# Patient Record
Sex: Male | Born: 1935 | Race: White | Hispanic: No | Marital: Married | Smoking: Former smoker
Health system: Southern US, Community
[De-identification: ages and names within clinical notes are randomized; demographics above are authoritative.]

## PROBLEM LIST (undated history)

## (undated) DIAGNOSIS — C61 Malignant neoplasm of prostate: Secondary | ICD-10-CM

## (undated) DIAGNOSIS — M109 Gout, unspecified: Secondary | ICD-10-CM

## (undated) DIAGNOSIS — H409 Unspecified glaucoma: Secondary | ICD-10-CM

## (undated) DIAGNOSIS — C3490 Malignant neoplasm of unspecified part of unspecified bronchus or lung: Secondary | ICD-10-CM

## (undated) DIAGNOSIS — C4490 Unspecified malignant neoplasm of skin, unspecified: Secondary | ICD-10-CM

## (undated) DIAGNOSIS — I10 Essential (primary) hypertension: Secondary | ICD-10-CM

## (undated) DIAGNOSIS — C439 Malignant melanoma of skin, unspecified: Secondary | ICD-10-CM

## (undated) HISTORY — DX: Malignant neoplasm of prostate: C61

## (undated) HISTORY — DX: Essential (primary) hypertension: I10

## (undated) HISTORY — DX: Malignant melanoma of skin, unspecified: C43.9

## (undated) HISTORY — DX: Gout, unspecified: M10.9

## (undated) HISTORY — DX: Unspecified glaucoma: H40.9

## (undated) HISTORY — PX: CATARACT EXTRACTION: SUR2

---

## 1999-11-19 ENCOUNTER — Inpatient Hospital Stay (HOSPITAL_COMMUNITY): Admission: EM | Admit: 1999-11-19 | Discharge: 1999-11-20 | Payer: Self-pay | Admitting: Emergency Medicine

## 1999-11-19 ENCOUNTER — Encounter: Payer: Self-pay | Admitting: Emergency Medicine

## 2001-06-23 ENCOUNTER — Ambulatory Visit: Admission: RE | Admit: 2001-06-23 | Discharge: 2001-09-21 | Payer: Self-pay | Admitting: Radiation Oncology

## 2016-02-09 DIAGNOSIS — M1A9XX0 Chronic gout, unspecified, without tophus (tophi): Secondary | ICD-10-CM | POA: Insufficient documentation

## 2016-02-09 DIAGNOSIS — C61 Malignant neoplasm of prostate: Secondary | ICD-10-CM | POA: Insufficient documentation

## 2016-02-09 DIAGNOSIS — E559 Vitamin D deficiency, unspecified: Secondary | ICD-10-CM | POA: Insufficient documentation

## 2016-02-09 DIAGNOSIS — I10 Essential (primary) hypertension: Secondary | ICD-10-CM | POA: Insufficient documentation

## 2016-06-07 ENCOUNTER — Encounter (HOSPITAL_COMMUNITY): Payer: Medicare Other | Attending: Oncology | Admitting: Oncology

## 2016-06-07 ENCOUNTER — Encounter (HOSPITAL_COMMUNITY): Payer: Self-pay

## 2016-06-07 DIAGNOSIS — Z8546 Personal history of malignant neoplasm of prostate: Secondary | ICD-10-CM

## 2016-06-07 DIAGNOSIS — I10 Essential (primary) hypertension: Secondary | ICD-10-CM

## 2016-06-07 DIAGNOSIS — Z87891 Personal history of nicotine dependence: Secondary | ICD-10-CM

## 2016-06-07 DIAGNOSIS — Z85820 Personal history of malignant melanoma of skin: Secondary | ICD-10-CM | POA: Diagnosis not present

## 2016-06-07 DIAGNOSIS — R918 Other nonspecific abnormal finding of lung field: Secondary | ICD-10-CM

## 2016-06-07 DIAGNOSIS — C3411 Malignant neoplasm of upper lobe, right bronchus or lung: Secondary | ICD-10-CM | POA: Insufficient documentation

## 2016-06-07 NOTE — Patient Instructions (Signed)
[place] at [facility] Discharge Instructions  RECOMMENDATIONS MADE BY THE CONSULTANT AND ANY TEST RESULTS WILL BE SENT TO YOUR REFERRING PHYSICIAN.  You were seen today by Dr. Twana First We will refer you to radiation oncology We will see you back in the office in 4 weeks See Amy up front for appointments   Thank you for choosing [place] at [facility] to provide your oncology and hematology care.  To afford each patient quality time with our provider, please arrive at least 15 minutes before your scheduled appointment time.    If you have a lab appointment with the [facility] please come in thru the  Main Entrance and check in at the main information desk  You need to re-schedule your appointment should you arrive 10 or more minutes late.  We strive to give you quality time with our providers, and arriving late affects you and other patients whose appointments are after yours.  Also, if you no show three or more times for appointments you may be dismissed from the clinic at the providers discretion.     Again, thank you for choosing [facility].  Our hope is that these requests will decrease the amount of time that you wait before being seen by our physicians.       _____________________________________________________________  Should you have questions after your visit to [facility], please contact our office at [phone] between the hours of 8:30 a.m. and 4:30 p.m.  Voicemails left after 4:30 p.m. will not be returned until the following business day.  For prescription refill requests, have your pharmacy contact our office.       Resources For Cancer Patients and their Caregivers ? American Cancer Society: Can assist with transportation, wigs, general needs, runs Look Good Feel Better.        613-112-2344 ? Cancer Care: Provides financial assistance, online support groups, medication/co-pay  assistance.  1-800-813-HOPE 507-314-7732) ? [place] Assists [place] cancer patients and their families through emotional , educational and financial support.  660 003 2304 ? [place] DSS Where to apply for food stamps, Medicaid and utility assistance. 361-417-8101 ? [employer]: Transportation to medical appointments. 719-824-1491 ? Social Security Administration: May apply for disability if have a Stage IV cancer. (909)540-1978 423 218 1347 ? [employer], Disability and Transit Services: Assists with nutrition, care and transit needs. [place] Support Programs: '@10RELATIVEDAYS'$ @ > Cancer Support Group  2nd Tuesday of the month 1pm-2pm, Journey Room  > Creative Journey  3rd Tuesday of the month 1130am-1pm, Journey Room  > Look Good Feel Better  1st Wednesday of the month 10am-12 noon, Journey Room (Call [place] to register [phone])

## 2016-06-07 NOTE — Progress Notes (Signed)
[place] NOTE  Patient Care Team: Octavio Graves, DO as PCP - General  CHIEF COMPLAINTS/PURPOSE OF CONSULTATION:   Right lung mass  HISTORY OF PRESENTING ILLNESS:  Robert Perry 83 y.o. male is here because of referral by [place] for a 1.4 cm spiculated mass suspicious for carcinoma.    Mr. Reasons presents to the clinic today accompanied by a friend for consultation of his lung cancer.   He had a CT C/A/P performed which demonstrated a 14 mm spiculated mass lesion in the right upper lobe just above the minor fissure, no associated lymphadenopathy.   Denies cough, sob, fatigue, weight loss, chest pain. Patient's friend states he is healthy and feeling good for a 83 year old person.   Patient quit smoking 45 years ago.   Patient is hard of hearing.     MEDICAL HISTORY:  Past Medical History:  Diagnosis Date  . Glaucoma   . Gout   . Hypertension   . Melanoma ([place])   . Prostate cancer [facility])     SURGICAL HISTORY: Past Surgical History:  Procedure Laterality Date  . CATARACT EXTRACTION      SOCIAL HISTORY: Social History   Social History  . Marital status: Married    Spouse name: N/A  . Number of children: N/A  . Years of education: N/A   Occupational History  . Not on file.   Social History Main Topics  . Smoking status: Former Smoker    Types: Cigarettes    Quit date: 1969  . Smokeless tobacco: Never Used  . Alcohol use No  . Drug use: No  . Sexual activity: No   Other Topics Concern  . Not on file   Social History Narrative  . No narrative on file    FAMILY HISTORY: Family History  Problem Relation Age of Onset  . Heart disease Father     ALLERGIES:  has no allergies on file.  MEDICATIONS:  Current Outpatient Prescriptions  Medication Sig Dispense Refill  . allopurinol (ZYLOPRIM) 300 MG tablet Take 300 mg by mouth daily.    Marland Kitchen amLODipine (NORVASC) 5 MG tablet Take 5 mg by mouth daily.    Marland Kitchen aspirin EC  81 MG tablet Take 81 mg by mouth daily.    Marland Kitchen atenolol (TENORMIN) 25 MG tablet Take 12.5 mg by mouth daily.    . benazepril (LOTENSIN) 20 MG tablet Take 20 mg by mouth daily.    . cholecalciferol (VITAMIN D) 1000 units tablet Take 1,000 Units by mouth daily.    Marland Kitchen latanoprost (XALATAN) 0.005 % ophthalmic solution Place 1 drop into both eyes nightly.    . timolol (TIMOPTIC) 0.5 % ophthalmic solution Place 1 drop into both eyes daily.     No current facility-administered medications for this visit.     Review of Systems  Constitutional: Negative.  Negative for malaise/fatigue.  HENT: Negative.   Eyes: Negative.   Respiratory: Negative.  Negative for cough and shortness of breath.   Cardiovascular: Negative.  Negative for chest pain.  Gastrointestinal: Negative.   Genitourinary: Negative.   Musculoskeletal: Negative.   Skin: Negative.   Neurological: Negative.   Endo/Heme/Allergies: Negative.   Psychiatric/Behavioral: Negative.   All other systems reviewed and are negative.  14 point ROS was done and is otherwise as detailed above or in HPI   PHYSICAL EXAMINATION: ECOG PERFORMANCE STATUS: 0 - Asymptomatic    Vitals:   06/07/16 0837  BP: (!) 150/49  Pulse: Marland Kitchen)  57  Resp: 18  Temp: 97.8 F (36.6 C)   Filed Weights   06/07/16 0837  Weight: 148 lb 1.6 oz (67.2 kg)     Physical Exam  Constitutional: He is oriented to person, place, and time and well-developed, well-nourished, and in no distress.  Patient was able to get on the exam table without assistance.   HENT:  Head: Normocephalic and atraumatic.  Patient is hard of hearing  Eyes: Conjunctivae and EOM are normal. Pupils are equal, round, and reactive to light.  Neck: Normal range of motion. Neck supple.  Cardiovascular: Normal rate, regular rhythm and normal heart sounds.   Pulmonary/Chest: Effort normal and breath sounds normal.  Abdominal: Soft. Bowel sounds are normal.  Musculoskeletal: Normal range of motion.    Neurological: He is alert and oriented to person, place, and time. Gait normal.  Skin: Skin is warm and dry.  Nursing note and vitals reviewed.     LABORATORY DATA:  I have reviewed the data as listed No results found for: WBC, HGB, HCT, MCV, PLT CMP  No results found for: NA, K, CL, CO2, GLUCOSE, BUN, CREATININE, CALCIUM, PROT, ALBUMIN, AST, ALT, ALKPHOS, BILITOT, GFRNONAA, GFRAA   RADIOGRAPHIC STUDIES: I have personally reviewed the radiological images as listed and agreed with the findings in the report. No results found.   PORTABLE CHEST 1 VIEW 05/14/2016  IMPRESSION: Calcified slightly tortuous aorta. Cardiomegaly. Central pulmonary vascular prominence. Question mass right middle lobe. Please see CT report.    CT CHEST, ABDOMEN AND PELVIS WITHOUT CONTRAST 05/14/2016  IMPRESSION: No acute or traumatic finding. Atherosclerosis including involvement of the coronary arteries, thoracic and abdominal aorta and aortic branch vessels. Multiple small aortic aneurysms. Largest measurable diameter in the infrarenal abdominal aorta is 3.3 cm. Recommend followup by ultrasound in 3 years. This recommendation follows ACR consensus guideline: White Paper of the ACR Incidental Findings Committee II on Vascular Findings. J Am Coll Radiol 2013; 10; 789-794 14 mm spiculated mass lesion in the right upper lobe just above the minor fissure. Considerable potential for malignancy. Consider one of the following in 3 months for both low-risk and high-risk individuals: (a) repeat chest Ct, (b) follow-up PET-Ct, or (c) tissue sampling. This recommendation follows the consensus statement: Guidelines for Management of Incidental Pulmonary Nodules Detected on CT Images: From the Fleischner Society 2017; Radiology 2017; 284;228-243. Hazy density in the right lower lobe that could be mild atelectasis or pneumonia or scarring.  No traumatic finding of the spine or ribs. 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 Lower lumbar degenerative changes. Nonobstructive 6 mm stone lower pole left kidney.  Cholelithiasis without CT evidence of cholecystitis. Diverticulosis without evidence of diverticulitis. Left inguinal hernia containing fat and a short segment of the sigmoid colon.  CT/CT HEAD W/O CM 05/14/2016  IMPRESSION: No skull fracture or intracranial hemorrhage.  No CT evidence of large acute infarct.   ASSESSMENT & PLAN:  Localized 1.4 cm spiculated RUL mass suspicious for malignancy. I have discussed proceeding with pulm consult for bronchscopy for tissue diagnosis, however patient is adamant that he does not want bronchoscopy to be done. I have discussed presumed diagnosis of lung cancer and treatment with either close surveillance follow up with repeat scans in 3 months vs. Definitive treatment with SBRT to the mass. They have opted for radiation-oncology consult for SBRT, therefore a referral has been made. .   RTC in 1 month for follow up.    All questions were answered.  The patient knows to call the clinic with any problems, questions or concerns.  This document serves as a record of services personally performed by Twana First, MD. It was created on her behalf by Shirlean Mylar, a trained medical scribe. The creation of this record is based on the scribe's personal observations and the provider's statements to them. This document has been checked and approved by the attending provider.  I have reviewed the above documentation for accuracy and completeness and I agree with the above.  This note was electronically signed.    [employer]  06/07/2016 8:57 AM

## 2016-06-08 ENCOUNTER — Encounter: Payer: Self-pay | Admitting: Radiation Oncology

## 2016-06-12 ENCOUNTER — Encounter: Payer: Self-pay | Admitting: Radiation Oncology

## 2016-06-12 NOTE — Progress Notes (Signed)
Thoracic Location of Tumor / Histology: Localized 1.4 cm spiculated RUL mass suspicious for malignancy  Patient presented to [facility] in [place] following a syncopal episode approximately 1 month ago.   Biopsies: Patient is adamant he does no want a bronchoscopy to be done.   Tobacco/Marijuana/Snuff/ETOH use: Former smoker but, quit 45 years ago (1969).  Past/Anticipated interventions by cardiothoracic surgery, if any: no  Past/Anticipated interventions by medical oncology, if any: Referred by PCP, Dr. Melina Copa, to Dr. Velna Ochs (oncologist) at [facility]. No chemotherapy was recommended.  Signs/Symptoms  Weight changes, if any: no  Respiratory complaints, if any: no  Hemoptysis, if any: no  Pain issues, if any:  no  SAFETY ISSUES:  Prior radiation? yes  Pacemaker/ICD? no  Possible current pregnancy?no  Is the patient on methotrexate? no  Current Complaints / other details:  83 year old male. Hx of melanoma and prostate cancer. Tx by Dr. Tammi Klippel for prostate cancer May 2003.   Denies cough, SOB, fatigue, weight loss, or chest pain.   Patient reports he lives independently. Patient accompanied today by family friend, Gwen. Gwen lives next door to the patient.

## 2016-06-13 ENCOUNTER — Ambulatory Visit
Admission: RE | Admit: 2016-06-13 | Discharge: 2016-06-13 | Disposition: A | Discharge status: Home / Self Care | Payer: Medicare Other | Source: Ambulatory Visit | Attending: Radiation Oncology | Admitting: Radiation Oncology

## 2016-06-13 ENCOUNTER — Encounter: Payer: Self-pay | Admitting: Radiation Oncology

## 2016-06-13 VITALS — BP 159/48 | HR 51 | Temp 97.8°F | Resp 18 | Ht 66.0 in | Wt 149.4 lb

## 2016-06-13 DIAGNOSIS — C61 Malignant neoplasm of prostate: Secondary | ICD-10-CM | POA: Diagnosis not present

## 2016-06-13 DIAGNOSIS — H409 Unspecified glaucoma: Secondary | ICD-10-CM | POA: Insufficient documentation

## 2016-06-13 DIAGNOSIS — D381 Neoplasm of uncertain behavior of trachea, bronchus and lung: Secondary | ICD-10-CM

## 2016-06-13 DIAGNOSIS — R918 Other nonspecific abnormal finding of lung field: Secondary | ICD-10-CM | POA: Insufficient documentation

## 2016-06-13 DIAGNOSIS — C3490 Malignant neoplasm of unspecified part of unspecified bronchus or lung: Secondary | ICD-10-CM | POA: Insufficient documentation

## 2016-06-13 DIAGNOSIS — Z7982 Long term (current) use of aspirin: Secondary | ICD-10-CM | POA: Diagnosis not present

## 2016-06-13 DIAGNOSIS — I10 Essential (primary) hypertension: Secondary | ICD-10-CM | POA: Insufficient documentation

## 2016-06-13 DIAGNOSIS — Z85820 Personal history of malignant melanoma of skin: Secondary | ICD-10-CM | POA: Insufficient documentation

## 2016-06-13 DIAGNOSIS — Z923 Personal history of irradiation: Secondary | ICD-10-CM | POA: Diagnosis not present

## 2016-06-13 DIAGNOSIS — M109 Gout, unspecified: Secondary | ICD-10-CM | POA: Insufficient documentation

## 2016-06-13 DIAGNOSIS — Z87891 Personal history of nicotine dependence: Secondary | ICD-10-CM | POA: Insufficient documentation

## 2016-06-13 HISTORY — DX: Malignant neoplasm of unspecified part of unspecified bronchus or lung: C34.90

## 2016-06-13 HISTORY — DX: Unspecified malignant neoplasm of skin, unspecified: C44.90

## 2016-06-13 NOTE — Progress Notes (Signed)
See progress note under physician encounter. 

## 2016-06-13 NOTE — Progress Notes (Signed)
Radiation Oncology         [phone] ________________________________  Initial outpatient Consultation  Name: Robert Perry MRN: [id number]  Date: 06/13/2016  DOB: 1923/08/22  PP:IRJJOAC BUTLER, DO  Twana First, MD   REFERRING PHYSICIAN: Twana First, MD  DIAGNOSIS: The primary encounter diagnosis was Mass of upper lobe of right lung. A diagnosis of Prostate CA [facility]) was also pertinent to this visit.    ICD-9-CM ICD-10-CM   1. Mass of upper lobe of right lung 786.6 R91.8 NM PET Image Initial (PI) Skull Base To Thigh  2. Prostate CA [facility]) 185 C61     HISTORY OF PRESENT ILLNESS: Robert Perry is a 83 y.o. male seen at the request of Dr. Talbert Cage for consultation regarding treatment options for a new 66m spiculated right upper lobe lung mass.  This mass was found incidentally on CT C/A/P obtained on 05/14/16 during an ED visit for evaluation of syncope, N/V and back pain. He was discharged home from the ED with Levaquin for possible RLL pneumonia in addition to the lung mass based on findings on CT scan demonstrating hazy opacity in the RLL. He was advised to follow up with his PCP, Dr. FBlanch Mediawhich he did.  Dr. FBlanch Mediareviewed his imaging and lab results and referred him for consultation with Dr. ZTalbert Cage  He was evaluated with Dr. ZTalbert Cageon 06/07/16 and was advised at that time to consider consultation with pulmonology for bronchoscopy/biopsy for tissue confirmation but patient refused. The patient has not had a PET scan.  The patient is here to discuss the role for radiation therapy in the management of his disease.  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   Treatment Dates: 07/10/2001 - 09/05/2001: Prostate and seminal vesicles were treated to 50.4 Gy in 28 fractions of 1.8 Gy. The prostate plus a small margin was boosted to 68.4 Gy with 9 additional fractions at 2 Gy. The prostate was a small margin, more  completely excluding the rectum, was boosted to 76.4 Gy with 4 additional fractions of 2 Gy.   PAST MEDICAL HISTORY:  Past Medical History:  Diagnosis Date  . Glaucoma   . Gout   . Hypertension   . Lung cancer ([facility]   . Melanoma ([facility]   . [place]state cancer ([facility]   . Skin cancer       PAST SURGICAL HISTORY: Past Surgical History:  Procedure Laterality Date  . CATARACT EXTRACTION      FAMILY HISTORY:  Family History  Problem Relation Age of Onset  . Heart disease Father   . Cancer Neg Hx     SOCIAL HISTORY:  Social History   Social History  . Marital status: Married    Spouse name: N/A  . Number of children: N/A  . Years of education: N/A   Occupational History  . Not on file.   Social History Main Topics  . Smoking status: Former Smoker    Types: Cigarettes    Quit date: 1969  . Smokeless tobacco: Never Used  . Alcohol use No  . Drug use: No  . Sexual activity: No   Other Topics Concern  . Not on file   Social History Narrative  . No narrative on file    ALLERGIES: Patient has no known allergies.  MEDICATIONS:  Current Outpatient Prescriptions  Medication Sig Dispense Refill  . allopurinol (ZYLOPRIM) 300 MG tablet Take 300 mg  by mouth daily.    Marland Kitchen amLODipine (NORVASC) 5 MG tablet Take 5 mg by mouth daily.    Marland Kitchen aspirin EC 81 MG tablet Take 81 mg by mouth daily.    Marland Kitchen atenolol (TENORMIN) 25 MG tablet Take 12.5 mg by mouth daily.    . benazepril (LOTENSIN) 20 MG tablet Take 20 mg by mouth daily.    . timolol (TIMOPTIC) 0.5 % ophthalmic solution Place 1 drop into both eyes daily.    . travoprost, benzalkonium, (TRAVATAN) 0.004 % ophthalmic solution 1 drop at bedtime.     No current facility-administered medications for this encounter.     REVIEW OF SYSTEMS:  On review of systems, the patient reports that he is doing well overall. He denies any chest pain, shortness of breath, cough, fevers, chills, night sweats, unintended weight changes, hemoptysis or  respiratory complaints. He denies change in energy level or appetite. He denies any bowel or bladder disturbances, and denies abdominal pain, nausea or vomiting. He denies any new musculoskeletal or joint aches or pains. He denies lower extremity swelling. A complete review of systems is obtained and is otherwise negative.   PHYSICAL EXAM:  Wt Readings from Last 3 Encounters:  06/13/16 149 lb 6.4 oz (67.8 kg)  06/07/16 148 lb 1.6 oz (67.2 kg)   Temp Readings from Last 3 Encounters:  06/13/16 97.8 F (36.6 C) (Oral)  06/07/16 97.8 F (36.6 C) (Oral)   BP Readings from Last 3 Encounters:  06/13/16 (!) 159/48  06/07/16 (!) 150/49   Pulse Readings from Last 3 Encounters:  06/13/16 (!) 51  06/07/16 (!) 57   Pain Assessment Pain Score: 0-No pain/10  In general this is a well appearing caucasian in no acute distress, appearing much younger than his stated age. He is alert and oriented x4 and appropriate throughout the examination. HEENT reveals that the patient is normocephalic, atraumatic. Patient has some hearing loss. EOMs are intact. PERRLA. Skin is intact without any evidence of gross lesions. Cardiovascular exam reveals a regular rate and rhythm, no clicks rubs or murmurs are auscultated. Chest is clear to auscultation bilaterally. Lymphatic assessment is performed and does not reveal any adenopathy in the cervical, supraclavicular, axillary, or inguinal chains. Abdomen has active bowel sounds in all quadrants and is intact. The abdomen is soft, non tender, non distended. Lower extremities are negative for pretibial pitting edema, deep calf tenderness, cyanosis or clubbing.   KPS = 100  100 - Normal; no complaints; no evidence of disease. 90   - Able to carry on normal activity; minor signs or symptoms of disease. 80   - Normal activity with effort; some signs or symptoms of disease. 43   - Cares for self; unable to carry on normal activity or to do active work. 60   - Requires  occasional assistance, but is able to care for most of his personal needs. 50   - Requires considerable assistance and frequent medical care. 75   - Disabled; requires special care and assistance. 69   - Severely disabled; hospital admission is indicated although death not imminent. 32   - Very sick; hospital admission necessary; active supportive treatment necessary. 10   - Moribund; fatal processes progressing rapidly. 0     - Dead  Karnofsky DA, Abelmann WH, Craver LS and Burchenal [facility] [phone]) The use of the nitrogen mustards in the palliative treatment of carcinoma: with particular reference to bronchogenic carcinoma Cancer 1 634-56  LABORATORY DATA:  No results found for:  WBC, HGB, HCT, MCV, PLT No results found for: NA, K, CL, CO2 No results found for: ALT, AST, GGT, ALKPHOS, BILITOT   RADIOGRAPHY: No results found.    IMPRESSION/PLAN: 1. 83 y.o. male with putative NSCLC of the right upper lobe. Today, we talked to the patient about the findings and work-up thus far.  We discussed the natural history of non-small cell lung cancer and general treatment, highlighting the role of radiotherapy in the management.  We discussed the available radiation techniques, and focused on the details of logistics and delivery of SBRT to the RUL.  We reviewed the anticipated acute and late sequelae associated with radiation in this setting.  The patient was encouraged to ask questions that we answered to the best of our ability. The patient will be scheduled for a PET scan for further non-invasive evaluation and disease staging. The patient would like to proceed with SBRT and will be tentatively scheduled for CT simulation pending PET results.   The above documentation reflects our direct findings during this shared patient visit.    [name], PA-C    Tyler Pita, MD  [facility] Direct Dial: [phone]  Fax: [phone] [email or web address]  Skype  [email or web address]  This  document serves as a record of services personally performed by Tyler Pita, MD and Freeman Caldron, PA-C. It was created on their behalf by Arlyce Harman, a trained medical scribe. The creation of this record is based on the scribe's personal observations and the provider's statements to them. This document has been checked and approved by the attending provider.

## 2016-06-14 ENCOUNTER — Ambulatory Visit
Admission: RE | Admit: 2016-06-14 | Discharge: 2016-06-14 | Disposition: A | Discharge status: Home / Self Care | Payer: 59 | Source: Ambulatory Visit | Attending: Radiation Oncology | Admitting: Radiation Oncology

## 2016-06-14 ENCOUNTER — Other Ambulatory Visit: Payer: Self-pay | Admitting: Radiation Oncology

## 2016-06-14 DIAGNOSIS — C61 Malignant neoplasm of prostate: Secondary | ICD-10-CM

## 2016-06-15 ENCOUNTER — Telehealth: Payer: Self-pay | Admitting: *Deleted

## 2016-06-15 NOTE — Telephone Encounter (Signed)
CALLED PATIENT TO INFORM OF PET ON 06-22-16 AND HIS [place] VISIT AND SIM ON 06-27-16 WITH DR. MANNING, LVM FOR A RETURN CALL

## 2016-06-18 ENCOUNTER — Ambulatory Visit: Admission: RE | Admit: 2016-06-18 | Payer: Medicare Other | Source: Ambulatory Visit | Admitting: Radiation Oncology

## 2016-06-18 ENCOUNTER — Institutional Professional Consult (permissible substitution): Payer: 59 | Admitting: Radiation Oncology

## 2016-06-18 ENCOUNTER — Telehealth: Payer: Self-pay | Admitting: Radiation Oncology

## 2016-06-18 NOTE — Telephone Encounter (Signed)
Per Shona Simpson, PA-C phoned patient to cancel consult until after PET on 06/22/16. Phoned patient's mobile number and spoke with friend, Meredith Mody. Explained appointment needed to be cancelled until after PET. She verbalized understanding. She confirmed 3/9 PET appointment. Also, she understands our scheduling staff will be in contact with a new appointment for follow up on a date after 06/22/2016.

## 2016-06-22 ENCOUNTER — Ambulatory Visit (HOSPITAL_COMMUNITY)
Admission: RE | Admit: 2016-06-22 | Discharge: 2016-06-22 | Disposition: A | Discharge status: Home / Self Care | Payer: Medicare Other | Source: Ambulatory Visit | Attending: Urology | Admitting: Urology

## 2016-06-22 DIAGNOSIS — R911 Solitary pulmonary nodule: Secondary | ICD-10-CM | POA: Insufficient documentation

## 2016-06-22 DIAGNOSIS — K4090 Unilateral inguinal hernia, without obstruction or gangrene, not specified as recurrent: Secondary | ICD-10-CM | POA: Insufficient documentation

## 2016-06-22 DIAGNOSIS — K5730 Diverticulosis of large intestine without perforation or abscess without bleeding: Secondary | ICD-10-CM | POA: Diagnosis not present

## 2016-06-22 DIAGNOSIS — R918 Other nonspecific abnormal finding of lung field: Secondary | ICD-10-CM | POA: Diagnosis present

## 2016-06-22 DIAGNOSIS — K8080 Other cholelithiasis without obstruction: Secondary | ICD-10-CM | POA: Insufficient documentation

## 2016-06-22 DIAGNOSIS — I714 Abdominal aortic aneurysm, without rupture: Secondary | ICD-10-CM | POA: Diagnosis not present

## 2016-06-22 LAB — GLUCOSE, CAPILLARY: Glucose-Capillary: 98 mg/dL (ref 65–99)

## 2016-06-22 MED ORDER — FLUDEOXYGLUCOSE F - 18 (FDG) INJECTION
7.4000 | Freq: Once | INTRAVENOUS | Status: AC | PRN
  Administered 2016-06-22: 7.4 via INTRAVENOUS

## 2016-06-25 NOTE — Progress Notes (Addendum)
Thoracic Location of Tumor / Histology: Localized 1.4 cm spiculated RUL mass suspicious for malignancy. Patient returns today to review PET scan results.   Patient presented to [facility] in [place] following a syncopal episode approximately 1 month ago.   Biopsies: Patient is adamant he does no want a bronchoscopy to be done.   Tobacco/Marijuana/Snuff/ETOH use: Former smoker but, quit 45 years ago (1969).  Past/Anticipated interventions by cardiothoracic surgery, if any: no  Past/Anticipated interventions by medical oncology, if any: Referred by PCP, Dr. Melina Copa, to Dr. Velna Ochs (oncologist) at [facility]. No chemotherapy was recommended.  Signs/Symptoms  Weight changes, if any: no  Respiratory complaints, if any: no  Hemoptysis, if any: no  Pain issues, if any:  no  SAFETY ISSUES:  Prior radiation? yes  Pacemaker/ICD? no  Possible current pregnancy?no  Is the patient on methotrexate? no  Current Complaints / other details:  83 year old male. Hx of melanoma and prostate cancer. Tx by Dr. Tammi Klippel for prostate cancer May 2003.   Denies cough, SOB, fatigue, weight loss, or chest pain.   Patient reports he lives independently. Patient accompanied today by family friend, Gwen. Gwen lives next door to the patient.   Diastolic bp low. Patient reports taking his atenolol this morning. Denies feeling lightheadedness, dizziness, or chest pain.

## 2016-06-27 ENCOUNTER — Ambulatory Visit
Admission: RE | Admit: 2016-06-27 | Discharge: 2016-06-27 | Disposition: A | Discharge status: Home / Self Care | Payer: Medicare Other | Source: Ambulatory Visit | Attending: Radiation Oncology | Admitting: Radiation Oncology

## 2016-06-27 ENCOUNTER — Encounter: Payer: Self-pay | Admitting: Radiation Oncology

## 2016-06-27 VITALS — BP 148/41 | HR 54 | Temp 97.6°F | Resp 18 | Wt 149.4 lb

## 2016-06-27 DIAGNOSIS — R918 Other nonspecific abnormal finding of lung field: Secondary | ICD-10-CM | POA: Diagnosis not present

## 2016-06-27 DIAGNOSIS — C61 Malignant neoplasm of prostate: Secondary | ICD-10-CM

## 2016-06-27 DIAGNOSIS — C3411 Malignant neoplasm of upper lobe, right bronchus or lung: Secondary | ICD-10-CM

## 2016-06-27 NOTE — Progress Notes (Signed)
  Radiation Oncology         [phone] ________________________________  Name: Robert Perry MRN: [id number]  Date: 06/27/2016  DOB: [birth year]  STEREOTACTIC BODY RADIOTHERAPY SIMULATION AND TREATMENT PLANNING NOTE    ICD-9-CM ICD-10-CM   1. 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  NARRATIVE:  The patient was brought to the [facility].  Identity was confirmed.  All relevant records and images related to the planned course of therapy were reviewed.  The patient freely provided informed written consent to proceed with treatment after reviewing the details related to the planned course of therapy. The consent form was witnessed and verified by the simulation staff.  Then, the patient was set-up in a stable reproducible  supine position for radiation therapy.  A BodyFix immobilization pillow was fabricated for reproducible positioning.  Then I personally applied the abdominal compression paddle to limit respiratory excursion.  4D respiratoy motion management CT images were obtained.  Surface markings were placed.  The CT images were loaded into the planning software.  Then, using Cine, MIP, and standard views, the internal target volume (ITV) and planning target volumes (PTV) were delinieated, and avoidance structures were contoured.  Treatment planning then occurred.  The radiation prescription was entered and confirmed.  A total of two complex treatment devices were fabricated in the form of the BodyFix immobilization pillow and a neck accuform cushion.  I have requested : 3D Simulation  I have requested a DVH of the following structures: Heart, Lungs, Esophagus, Chest Wall, Brachial Plexus, Major Blood Vessels, and targets.  SPECIAL TREATMENT PROCEDURE:  The planned course of therapy using radiation constitutes a special treatment procedure. Special care is  required in the management of this patient for the following reasons. This treatment constitutes a Special Treatment Procedure for the following reason: [ High dose per fraction requiring special monitoring for increased toxicities of treatment including daily imaging..  The special nature of the planned course of radiotherapy will require increased physician supervision and oversight to ensure patient's safety with optimal treatment outcomes.  RESPIRATORY MOTION MANAGEMENT SIMULATION:  In order to account for effect of respiratory motion on target structures and other organs in the planning and delivery of radiotherapy, this patient underwent respiratory motion management simulation.  To accomplish this, when the patient was brought to the CT simulation planning suite, 4D respiratoy motion management CT images were obtained.  The CT images were loaded into the planning software.  Then, using a variety of tools including Cine, MIP, and standard views, the target volume and planning target volumes (PTV) were delineated.  Avoidance structures were contoured.  Treatment planning then occurred.  Dose volume histograms were generated and reviewed for each of the requested structure.  The resulting plan was carefully reviewed and approved today.  PLAN:  The patient will receive 54 Gy in 3 fractions.  ________________________________  Sheral Apley Tammi Klippel, M.D.

## 2016-06-27 NOTE — Progress Notes (Signed)
Radiation Oncology         [phone] ________________________________  Initial outpatient Consultation  Name: Robert Perry MRN: [id number]  Date: 06/27/2016  DOB: [birth year]  KC:[id number] BUTLER, DO  Twana First, MD   REFERRING PHYSICIAN: Twana First, MD  DIAGNOSIS: The primary encounter diagnosis was Mass of upper lobe of right lung. A diagnosis of Prostate CA [facility]) was also pertinent to this visit.    ICD-9-CM ICD-10-CM   1. Mass of upper lobe of right lung 786.6 R91.8   2. Prostate CA [facility]) 185 C61     HISTORY OF PRESENT ILLNESS: Robert Perry is a 83 y.o. male seen initially on 06/13/16 at the request of Dr. Talbert Cage for consultation regarding treatment options for a new 61m spiculated right upper lobe lung mass.  This mass was found incidentally on CT C/A/P obtained on 05/14/16 during an ED visit for evaluation of syncope, N/V and back pain. He was discharged home from the ED with Levaquin for possible RLL pneumonia in addition to the lung mass based on findings on CT scan demonstrating hazy opacity in the RLL. He was advised to follow up with his PCP, Dr. FBlanch Mediawhich he did.  Dr. FBlanch Mediareviewed his imaging and lab results and referred him for consultation with Dr. ZTalbert Cage  He was evaluated with Dr. ZTalbert Cageon 06/07/16 and was advised at that time to consider consultation with pulmonology for bronchoscopy/biopsy for tissue confirmation but patient refused. The patient recently had a PET scan on 06/22/16 for further evaluation and disease staging.  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  He has done well since that time.  The patient presents to the clinic today for discussion of his recent PET scan from 06/22/16 the role that radiation may play in the treatment of his disease. The patient is currently living independently, and is accompanied today by family friend and neighbor, Robert Perry He  denies cough, shortness of breath, weight loss, or chest pain. He denies lightheadedness or dizziness. The patient reports taking atenolol this morning.   Final PET results were pending during visit and now show malignant metabolism with the RUL nodule and no met to nodes or elsewhere.  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   Treatment Dates: 07/10/2001 - 09/05/2001: Prostate and seminal vesicles were treated to 50.4 Gy in 28 fractions of 1.8 Gy. The prostate plus a small margin was boosted to 68.4 Gy with 9 additional fractions at 2 Gy. The prostate was a small margin, more completely excluding the rectum, was boosted to 76.4 Gy with 4 additional fractions of 2 Gy.   PAST MEDICAL HISTORY:  Past Medical History:  Diagnosis Date  . Glaucoma   . Gout   . Hypertension   . [place]   . [place]anoma ([place]   . [place]   . Skin cancer       PAST SURGICAL HISTORY: Past Surgical History:  Procedure Laterality Date  . CATARACT EXTRACTION      FAMILY HISTORY:  Family History  Problem Relation Age of Onset  . Heart disease Father   . Cancer Neg Hx     SOCIAL HISTORY:  Social History   Social History  . Marital status: Married    Spouse name: N/A  .  Number of children: N/A  . Years of education: N/A   Occupational History  . Not on file.   Social History Main Topics  . Smoking status: Former Smoker    Types: Cigarettes    Quit date: 1969  . Smokeless tobacco: Never Used  . Alcohol use No  . Drug use: No  . Sexual activity: No   Other Topics Concern  . Not on file   Social History Narrative  . No narrative on file    ALLERGIES: Patient has no known allergies.  MEDICATIONS:  Current Outpatient Prescriptions  Medication Sig Dispense Refill  . allopurinol (ZYLOPRIM) 300 MG tablet Take 300 mg by mouth daily.    Marland Kitchen amLODipine (NORVASC) 5 MG tablet Take 5  mg by mouth daily.    Marland Kitchen aspirin EC 81 MG tablet Take 81 mg by mouth daily.    Marland Kitchen atenolol (TENORMIN) 25 MG tablet Take 12.5 mg by mouth daily.    . benazepril (LOTENSIN) 20 MG tablet Take 20 mg by mouth daily.    . timolol (TIMOPTIC) 0.5 % ophthalmic solution Place 1 drop into both eyes daily.    . travoprost, benzalkonium, (TRAVATAN) 0.004 % ophthalmic solution 1 drop at bedtime.     No current facility-administered medications for this encounter.     REVIEW OF SYSTEMS:  On review of systems, the patient reports that he is doing well overall. He denies any chest pain, shortness of breath, cough, fevers, chills, night sweats, unintended weight changes, hemoptysis or respiratory complaints. He denies change in energy level or appetite. He denies any bowel or bladder disturbances, and denies abdominal pain, nausea or vomiting. He denies any new musculoskeletal or joint aches or pains. He denies lower extremity swelling. A complete review of systems is obtained and is otherwise negative.   PHYSICAL EXAM:  Wt Readings from Last 3 Encounters:  06/27/16 149 lb 6.4 oz (67.8 kg)  06/13/16 149 lb 6.4 oz (67.8 kg)  06/07/16 148 lb 1.6 oz (67.2 kg)   Temp Readings from Last 3 Encounters:  06/27/16 97.6 F (36.4 C) (Oral)  06/13/16 97.8 F (36.6 C) (Oral)  06/07/16 97.8 F (36.6 C) (Oral)   BP Readings from Last 3 Encounters:  06/27/16 (!) 148/41  06/13/16 (!) 159/48  06/07/16 (!) 150/49   Pulse Readings from Last 3 Encounters:  06/27/16 (!) 54  06/13/16 (!) 51  06/07/16 (!) 57   In general this is a well appearing Caucasian man in no acute distress. He's alert and oriented x4 and appropriate throughout the examination. Cardiopulmonary assessment is negative for acute distress and he exhibits normal effort.    KPS = 100   100 - Normal; no complaints; no evidence of disease. 90   - Able to carry on normal activity; minor signs or symptoms of disease. 80   - Normal activity with effort;  some signs or symptoms of disease. 22   - Cares for self; unable to carry on normal activity or to do active work. 60   - Requires occasional assistance, but is able to care for most of his personal needs. 50   - Requires considerable assistance and frequent medical care. 55   - Disabled; requires special care and assistance. 2   - Severely disabled; hospital admission is indicated although death not imminent. 22   - Very sick; hospital admission necessary; active supportive treatment necessary. 10   - Moribund; fatal processes progressing rapidly. 0     - Dead  Karnofsky DA, Abelmann WH, Craver LS and [facility] [phone]) The use of the nitrogen mustards in the palliative treatment of carcinoma: with particular reference to bronchogenic carcinoma Cancer 1 634-56  LABORATORY DATA:  No results found for: WBC, HGB, HCT, MCV, PLT No results found for: NA, K, CL, CO2 No results found for: ALT, AST, GGT, ALKPHOS, BILITOT   RADIOGRAPHY: Nm Pet Image Initial (pi) Skull Base To Thigh  Result Date: 06/27/2016 CLINICAL DATA:  Initial treatment strategy for right upper lobe pulmonary nodule. EXAM: NUCLEAR MEDICINE PET SKULL BASE TO THIGH TECHNIQUE: 7.4 mCi F-18 FDG was injected intravenously. Full-ring PET imaging was performed from the skull base to thigh after the radiotracer. CT data was obtained and used for attenuation correction and anatomic localization. FASTING BLOOD GLUCOSE:  Value: 98 mg/dl COMPARISON:  CT on 05/14/2016 FINDINGS: NECK No hypermetabolic lymph nodes in the neck. CHEST 13 mm right upper lobe pulmonary nodule shows FDG uptake, with SUV max of 2.6. No other suspicious pulmonary nodules identifying on CT images. No evidence pleural effusion. Tiny sub-cm bilateral hilar and subcarinal mediastinal lymph nodes are not pathologically enlarged, but show FDG uptake with SUV max up to 5.6 in the left hilar region. No pathologically enlarged hypermetabolic lymph nodes identified. Aortic and  coronary artery atherosclerosis noted. ABDOMEN/PELVIS No abnormal hypermetabolic activity within the liver, pancreas, adrenal glands, or spleen. No hypermetabolic lymph nodes in the abdomen or pelvis. Tiny calcified gallstones seen without evidence of cholecystitis. Small nonobstructive calculus noted in lower pole of left kidney. Stable 3.3 cm infrarenal abdominal aortic aneurysm. Colonic diverticulosis again seen with most severe involvement of the sigmoid colon. No evidence of diverticulitis. Stable small left inguinal hernia containing a loop of sigmoid colon. SKELETON No focal hypermetabolic activity to suggest skeletal metastasis. IMPRESSION: 1.3 cm hypermetabolic right upper lobe pulmonary nodule, highly suspicious for primary bronchogenic carcinoma. Sub-cm hypermetabolic bilateral hilar and subcarinal mediastinal lymph nodes, none of which are pathologically enlarged. This distribution suggests an inflammatory etiology, with metastatic disease considered less likely. No evidence of distant metastatic disease. Stable 3.3 cm infrarenal abdominal aortic aneurysm. Recommend followup by [place] in 3 years. This recommendation follows ACR consensus guidelines: White Paper of the ACR Incidental Findings Committee II on Vascular Findings. J Am Coll Radiol 2013; 10:789-794. Other incidental findings described above, also stable. Electronically Signed   By: Earle Gell M.D.   On: 06/27/2016 09:43      IMPRESSION/PLAN: 1. 83 y.o. male with putative NSCLC of the right upper lobe.  We personally reviewed the imaging results to date, including recent PET scan.  We talked to the patient about the findings and work-up thus far.  We again reviewed the natural history of non-small cell lung cancer and general treatment, highlighting the role of radiotherapy in the management.  We discussed the available radiation techniques, and focused on the details of logistics and delivery of SBRT to the RUL.  We reviewed the anticipated  acute and late sequelae associated with radiation in this setting.  The patient was encouraged to ask questions that we answered to the best of our ability. The patient would like to proceed with SBRT and is scheduled for CT simulation following his visit today. A consent form was discussed, signed, and placed in the patient's chart.  The above documentation reflects our direct findings during this shared patient visit.    [name], PA-C    Tyler Pita, MD  [facility] Direct Dial: [phone]  Fax: [phone] [email or web address]  Skype  [email or web address]   This document serves as a record of services personally performed by Tyler Pita, MD and Freeman Caldron, PA-C. It was created on their behalf by Maryla Morrow, a trained medical scribe. The creation of this record is based on the scribe's personal observations and the provider's statements to them. This document has been checked and approved by the attending provider.

## 2016-06-27 NOTE — Progress Notes (Signed)
See progress note under physician encounter. 

## 2016-07-05 ENCOUNTER — Ambulatory Visit (HOSPITAL_COMMUNITY): Payer: 59

## 2016-07-06 DIAGNOSIS — R918 Other nonspecific abnormal finding of lung field: Secondary | ICD-10-CM | POA: Diagnosis not present

## 2016-07-23 ENCOUNTER — Ambulatory Visit
Admission: RE | Admit: 2016-07-23 | Discharge: 2016-07-23 | Disposition: A | Discharge status: Home / Self Care | Payer: Medicare Other | Source: Ambulatory Visit | Attending: Radiation Oncology | Admitting: Radiation Oncology

## 2016-07-23 DIAGNOSIS — R918 Other nonspecific abnormal finding of lung field: Secondary | ICD-10-CM | POA: Diagnosis not present

## 2016-07-24 ENCOUNTER — Ambulatory Visit: Payer: Medicare Other | Admitting: Radiation Oncology

## 2016-07-25 ENCOUNTER — Ambulatory Visit: Payer: Medicare Other | Admitting: Radiation Oncology

## 2016-07-26 ENCOUNTER — Ambulatory Visit: Payer: Medicare Other | Admitting: Radiation Oncology

## 2016-07-27 ENCOUNTER — Ambulatory Visit
Admission: RE | Admit: 2016-07-27 | Discharge: 2016-07-27 | Disposition: A | Discharge status: Home / Self Care | Payer: Medicare Other | Source: Ambulatory Visit | Attending: Radiation Oncology | Admitting: Radiation Oncology

## 2016-07-27 DIAGNOSIS — R918 Other nonspecific abnormal finding of lung field: Secondary | ICD-10-CM | POA: Diagnosis not present

## 2016-07-31 ENCOUNTER — Ambulatory Visit: Payer: Medicare Other | Admitting: Radiation Oncology

## 2016-08-01 ENCOUNTER — Ambulatory Visit
Admission: RE | Admit: 2016-08-01 | Discharge: 2016-08-01 | Disposition: A | Discharge status: Home / Self Care | Payer: Medicare Other | Source: Ambulatory Visit | Attending: Radiation Oncology | Admitting: Radiation Oncology

## 2016-08-01 DIAGNOSIS — R918 Other nonspecific abnormal finding of lung field: Secondary | ICD-10-CM | POA: Diagnosis not present

## 2016-08-02 ENCOUNTER — Encounter: Payer: Self-pay | Admitting: Radiation Oncology

## 2016-08-02 ENCOUNTER — Ambulatory Visit: Payer: Medicare Other | Admitting: Radiation Oncology

## 2016-08-02 NOTE — Progress Notes (Signed)
  Radiation Oncology         [phone] ________________________________  Name: Robert Perry MRN: [id number]  Date: 08/02/2016  DOB: [birth year]  End of Treatment Note  Diagnosis:  83 y.o. male with putative NSCLC of the right upper lobe     Indication for treatment:  Curative      Radiation treatment dates:   07/23/16-08/01/16  Site/dose:  Right lung/ 54 Gy in 3 fractions  Beams/energy:   SBRT SRT-VMAT/ 6FFF  Narrative: The patient tolerated radiation treatment relatively well.    Plan: The patient has completed radiation treatment. The patient will return to radiation oncology clinic for routine followup in one month. I advised him to call or return sooner if he has any questions or concerns related to his recovery or treatment. ________________________________  Sheral Apley. Tammi Klippel, M.D.   This document serves as a record of services personally performed by Tyler Pita, MD. It was created on his behalf by Bethann Humble, a trained medical scribe. The creation of this record is based on the scribe's personal observations and the provider's statements to them. This document has been checked and approved by the attending provider.

## 2016-08-13 ENCOUNTER — Ambulatory Visit (HOSPITAL_COMMUNITY): Payer: Medicare Other

## 2016-09-04 NOTE — Progress Notes (Signed)
Robert Perry 83 y.o.male with putative NSCLC of the right upper lobe, six week FU.       Weight changes,if any: Wt Readings from Last 3 Encounters:  09/11/16 151 lb 6.4 oz (68.7 kg)  06/27/16 149 lb 6.4 oz (67.8 kg)  06/13/16 149 lb 6.4 oz (67.8 kg)   Respiratory complaints, if any: [name] SOB,coughing or wheezingHemoptysis, if any:   Swallowing Problems/Pain/Difficulty swallowing:Denies having problems swallowing while eating or drinking. Appetite :Good eating three meals a day and snacks. Pain:None When is next chemo scheduled?:N/A Imaging:N/A Lab work from of chart:N/A 06-07-16 Saw Dr. Twana First for lung consult RTC in 1 month for follow up BP (!) 141/51   Pulse (!) 52   Temp 97.8 F (36.6 C) (Oral)   Resp 18   Ht '5\' 6"'$  (1.676 m)   Wt 151 lb 6.4 oz (68.7 kg)   SpO2 99%   BMI 24.44 kg/m

## 2016-09-11 ENCOUNTER — Encounter: Payer: Self-pay | Admitting: Urology

## 2016-09-11 ENCOUNTER — Ambulatory Visit
Admission: RE | Admit: 2016-09-11 | Discharge: 2016-09-11 | Disposition: A | Discharge status: Home / Self Care | Payer: Medicare Other | Source: Ambulatory Visit | Attending: Urology | Admitting: Urology

## 2016-09-11 VITALS — BP 141/51 | HR 52 | Temp 97.8°F | Resp 18 | Ht 66.0 in | Wt 151.4 lb

## 2016-09-11 DIAGNOSIS — C3411 Malignant neoplasm of upper lobe, right bronchus or lung: Secondary | ICD-10-CM | POA: Insufficient documentation

## 2016-09-11 NOTE — Progress Notes (Signed)
  Radiation Oncology         [phone] ________________________________  Name: Robert Perry MRN: [id number]  Date: 09/11/2016  DOB: 04/17/1923  Post Treatment Note  CC: Robert Graves, DO  Robert First, MD  Diagnosis:   83 y.o.male with putative NSCLC of the right upper lobe     Interval Since Last Radiation:  5 weeks    07/23/16-08/01/16:  Right lung/ 54 Gy in 3 fractions  Narrative:  The patient returns today for routine follow-up.   He tolerated his treatment well without any untoward side effects.                            On review of systems, the patient states that he is doing well and currently without complaints. He denies any productive cough, fever, chills, shortness of breath or increased dyspnea on exertion. He reports a healthy appetite and is maintaining his weight. He denies any dysphasia, nausea, vomiting, constipation, diarrhea or abdominal pain.   ALLERGIES:  has No Known Allergies.  Meds: Current Outpatient Prescriptions  Medication Sig Dispense Refill  . allopurinol (ZYLOPRIM) 300 MG tablet Take 300 mg by mouth daily.    Marland Kitchen amLODipine (NORVASC) 5 MG tablet Take 5 mg by mouth daily.    Marland Kitchen aspirin EC 81 MG tablet Take 81 mg by mouth daily.    Marland Kitchen atenolol (TENORMIN) 25 MG tablet Take 12.5 mg by mouth daily.    . benazepril (LOTENSIN) 20 MG tablet Take 20 mg by mouth daily.    . timolol (TIMOPTIC) 0.5 % ophthalmic solution Place 1 drop into both eyes daily.    . travoprost, benzalkonium, (TRAVATAN) 0.004 % ophthalmic solution 1 drop at bedtime.     No current facility-administered medications for this encounter.     Physical Findings:  height is 5\' 6"  (1.676 m) and weight is 151 lb 6.4 oz (68.7 kg). His oral temperature is 97.8 F (36.6 C). His blood pressure is 141/51 (abnormal) and his pulse is 52 (abnormal). His respiration is 18 and oxygen saturation is 99%.  Pain Assessment Pain Score: 0-No pain/10 In general this is a well appearing caucasian male in  no acute distress. He's alert and oriented x4 and appropriate throughout the examination. Cardiopulmonary assessment is negative for acute distress and he exhibits normal effort.   Lab Findings: No results found for: WBC, HGB, HCT, MCV, PLT   Radiographic Findings: No results found.  Impression/Plan: 1. The patient appears to be doing well following radiotherapy. We will proceed with a CT scan of the chest with contrast and BMP to make sure that he can receive contrast before he undergoes a CT scan in the next few weeks. I'll follow up with his friend, Robert Perry, by phone with these results, and provided that this scan is stable, we will move forward with serial imaging at six-month intervals until 5 years when, at that point in time, he will have an annual low dose CT scan for surveillance. He will return in 6 months to review the findings from the scan to be ordered prior to that visit, provided that the scan ordered today is stable.    Robert Johns, PA-C

## 2016-09-11 NOTE — Addendum Note (Signed)
Encounter addended by: Malena Edman, RN on: 09/11/2016  3:50 PM<BR>    Actions taken: Charge Capture section accepted

## 2016-09-18 ENCOUNTER — Telehealth: Payer: Self-pay | Admitting: *Deleted

## 2016-09-18 NOTE — Telephone Encounter (Signed)
CALLED PATIENT TO INFORM OF LAB AND SCAN ON 09-25-16- ARRIVAL TIME - 1 PM , PT. TO HAVE CLEAR LIQUIDS ONLY - 4 HRS. PRIOR TO TEST, TEST TO BE @ [facility], SPOKE WITH GWEN GROGAN AND SHE IS AWARE OF THIS APPT.

## 2016-09-25 ENCOUNTER — Ambulatory Visit (HOSPITAL_COMMUNITY)
Admission: RE | Admit: 2016-09-25 | Discharge: 2016-09-25 | Disposition: A | Discharge status: Home / Self Care | Payer: Medicare Other | Source: Ambulatory Visit | Attending: Urology | Admitting: Urology

## 2016-09-25 ENCOUNTER — Encounter (HOSPITAL_COMMUNITY): Payer: Self-pay

## 2016-09-25 DIAGNOSIS — K8020 Calculus of gallbladder without cholecystitis without obstruction: Secondary | ICD-10-CM | POA: Insufficient documentation

## 2016-09-25 DIAGNOSIS — I289 Disease of pulmonary vessels, unspecified: Secondary | ICD-10-CM | POA: Insufficient documentation

## 2016-09-25 DIAGNOSIS — I2510 Atherosclerotic heart disease of native coronary artery without angina pectoris: Secondary | ICD-10-CM | POA: Insufficient documentation

## 2016-09-25 DIAGNOSIS — R911 Solitary pulmonary nodule: Secondary | ICD-10-CM | POA: Diagnosis not present

## 2016-09-25 DIAGNOSIS — C3411 Malignant neoplasm of upper lobe, right bronchus or lung: Secondary | ICD-10-CM | POA: Diagnosis not present

## 2016-09-25 DIAGNOSIS — J439 Emphysema, unspecified: Secondary | ICD-10-CM | POA: Diagnosis not present

## 2016-09-25 DIAGNOSIS — K769 Liver disease, unspecified: Secondary | ICD-10-CM | POA: Diagnosis not present

## 2016-09-25 DIAGNOSIS — I700 Atherosclerosis of aorta: Secondary | ICD-10-CM | POA: Insufficient documentation

## 2016-09-25 LAB — POCT I-STAT CREATININE: Creatinine, Ser: 1.1 mg/dL (ref 0.61–1.24)

## 2016-09-25 MED ORDER — IOPAMIDOL (ISOVUE-300) INJECTION 61%
75.0000 mL | Freq: Once | INTRAVENOUS | Status: AC | PRN
  Administered 2016-09-25: 75 mL via INTRAVENOUS

## 2016-09-25 MED ORDER — IOPAMIDOL (ISOVUE-300) INJECTION 61%
INTRAVENOUS | Status: AC
  Filled 2016-09-25: qty 75

## 2016-09-26 ENCOUNTER — Telehealth: Payer: Self-pay | Admitting: Urology

## 2016-09-26 DIAGNOSIS — C3411 Malignant neoplasm of upper lobe, right bronchus or lung: Secondary | ICD-10-CM

## 2016-09-26 NOTE — Telephone Encounter (Signed)
I called and spoke with "Bill's" friend Meredith Mody to inform her of the results from the recent post treatment CT scan.  I advised that the RUL lesion has decreased in size and appears to have a good response to therapy.  No new lesions were noted in the lungs.  We will plan to repeat a chest CT scan in 3 months and pending stability at that time, we will consider extending to surveillance scans q 6 months.  She states her understanding and agreement with this plan and will share this information with Mr. Holifield.

## 2016-12-26 ENCOUNTER — Telehealth: Payer: Self-pay | Admitting: *Deleted

## 2016-12-26 NOTE — Telephone Encounter (Signed)
CALLED PATIENT TO INFORM OF LAB AND CT FOR 12-31-16 , AND HIS FU VISIT WITH ASHLYN BRUNING ON 01-03-17 @ 9:30 AM, SPOKE WITH GWEN GROGAN AND SHE IS AWARE OF THESE APPTS.

## 2016-12-27 ENCOUNTER — Inpatient Hospital Stay
Admission: RE | Admit: 2016-12-27 | Discharge: 2016-12-27 | Disposition: A | Discharge status: Home / Self Care | Payer: Self-pay | Source: Ambulatory Visit | Attending: Urology | Admitting: Urology

## 2016-12-27 NOTE — Progress Notes (Signed)
83 y.o.male with putative NSCLC of the right upper lobe radiation completed 08-01-16, review 12-31-16 CT chest w contrast, F.U.  Weight changes, if any: None Respiratory complaints, if any:  Denies any issue with breathing. Hemoptysis, if any:   None Swallowing Problems/Pain/Difficulty swallowing: None Smoking Tobacco/Marijuana/Snuff/ETOH use: Stop smoking in 1969 Appetite : Good Pain: None When is next chemo scheduled?: NA Imaging:12-31-16 Ct chest w contrast Lab work from of chart: 09-17- 18 creatinine 1.00 01/02/2017 Vitals:   01/03/17 0922  BP: (!) 143/39  Pulse: (!) 50  Resp: 18  Temp: 97.8 F (36.6 C)  TempSrc: Oral  SpO2: 99%  Weight: 150 lb 2 oz (68.1 kg)   Wt Readings from Last 3 Encounters:  01/03/17 150 lb 2 oz (68.1 kg)  09/11/16 151 lb 6.4 oz (68.7 kg)  06/27/16 149 lb 6.4 oz (67.8 kg)

## 2016-12-31 ENCOUNTER — Ambulatory Visit (HOSPITAL_COMMUNITY): Payer: Medicare Other

## 2016-12-31 ENCOUNTER — Ambulatory Visit: Payer: Medicare Other

## 2017-01-02 ENCOUNTER — Ambulatory Visit: Admission: RE | Admit: 2017-01-02 | Payer: Medicare Other | Source: Ambulatory Visit

## 2017-01-02 ENCOUNTER — Ambulatory Visit (HOSPITAL_COMMUNITY)
Admission: RE | Admit: 2017-01-02 | Discharge: 2017-01-02 | Disposition: A | Discharge status: Home / Self Care | Payer: Medicare Other | Source: Ambulatory Visit | Attending: Urology | Admitting: Urology

## 2017-01-02 ENCOUNTER — Encounter (HOSPITAL_COMMUNITY): Payer: Self-pay

## 2017-01-02 ENCOUNTER — Other Ambulatory Visit: Payer: Self-pay | Admitting: Urology

## 2017-01-02 DIAGNOSIS — I700 Atherosclerosis of aorta: Secondary | ICD-10-CM | POA: Insufficient documentation

## 2017-01-02 DIAGNOSIS — C3411 Malignant neoplasm of upper lobe, right bronchus or lung: Secondary | ICD-10-CM

## 2017-01-02 DIAGNOSIS — J700 Acute pulmonary manifestations due to radiation: Secondary | ICD-10-CM | POA: Insufficient documentation

## 2017-01-02 DIAGNOSIS — J439 Emphysema, unspecified: Secondary | ICD-10-CM | POA: Insufficient documentation

## 2017-01-02 LAB — POCT I-STAT CREATININE: Creatinine, Ser: 1 mg/dL (ref 0.61–1.24)

## 2017-01-02 MED ORDER — IOPAMIDOL (ISOVUE-300) INJECTION 61%
75.0000 mL | Freq: Once | INTRAVENOUS | Status: AC | PRN
  Administered 2017-01-02: 75 mL via INTRAVENOUS

## 2017-01-02 MED ORDER — IOPAMIDOL (ISOVUE-300) INJECTION 61%
INTRAVENOUS | Status: AC
  Filled 2017-01-02: qty 75

## 2017-01-03 ENCOUNTER — Ambulatory Visit
Admission: RE | Admit: 2017-01-03 | Discharge: 2017-01-03 | Disposition: A | Discharge status: Home / Self Care | Payer: Medicare Other | Source: Ambulatory Visit | Attending: Urology | Admitting: Urology

## 2017-01-03 ENCOUNTER — Encounter: Payer: Self-pay | Admitting: Urology

## 2017-01-03 VITALS — BP 143/39 | HR 50 | Temp 97.8°F | Resp 18 | Wt 150.1 lb

## 2017-01-03 DIAGNOSIS — C3411 Malignant neoplasm of upper lobe, right bronchus or lung: Secondary | ICD-10-CM | POA: Diagnosis not present

## 2017-01-03 DIAGNOSIS — C3490 Malignant neoplasm of unspecified part of unspecified bronchus or lung: Secondary | ICD-10-CM

## 2017-01-03 NOTE — Addendum Note (Signed)
Encounter addended by: Sherrlyn Hock, LPN on: 9/79/1504 13:64 AM<BR>    Actions taken: Charge Capture section accepted

## 2017-01-03 NOTE — Progress Notes (Signed)
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 Prostate and seminal vesicles were treated to 50.4 Gy in 28 fractions of 1.8 Gy.  2. The prostate plus a small margin was boosted to 68.4 Gy with 9 additional fractions at 2 Gy.  3. The prostate plus a small margin, more completely excluding the rectum, was boosted to 76.4 Gy with 4  additional fractions of 2 Gy.   Narrative:  The patient returns today for routine follow-up. He has recovered well from the effects of radiotherapy. He presents today, with his friend, Rebekah Chesterfield, to review results from his recent follow up CT Chest performed on 01/02/17. This study revealed a stable 11 mm right upper lobe pulmonary nodule. There is new mild radiation pneumonitis in the adjacent right upper lobe and a new 5 mm right lower lobe pulmonary nodule, likely inflammatory in etiology. Recommendation is for continued attention on follow-up CT.  No evidence of lymphadenopathy or pleural effusion.             His friend and caregiver, Rebekah Chesterfield, has recently been diagnosed with RA and inflammatory bowel disease.  She recently had a partial bowel resection and is recovering well.  Mr. Hashemi has been a big help in assisting her through her recovery.     On review of systems, the patient states that he is doing well and remains without complaints. He denies any productive cough, hemoptysis, fever, chills, shortness of breath or increased dyspnea on exertion. He reports a healthy appetite and is maintaining his weight. He denies any dysphasia, nausea, vomiting, constipation, diarrhea or abdominal pain. He denies  lightheadedness, dizziness or increased fatigue.  ALLERGIES:  has No Known Allergies.  Meds: Current Outpatient Prescriptions  Medication Sig Dispense Refill  . allopurinol (ZYLOPRIM) 300 MG tablet Take 300 mg by mouth daily.    Marland Kitchen amLODipine (NORVASC) 5 MG tablet Take 5 mg by mouth daily.    Marland Kitchen aspirin EC 81 MG tablet Take 81 mg by mouth daily.    Marland Kitchen atenolol (TENORMIN) 25 MG tablet Take 12.5 mg by mouth daily.    . benazepril (LOTENSIN) 20 MG tablet Take 20 mg by mouth daily.    . timolol (TIMOPTIC) 0.5 % ophthalmic solution Place 1 drop into both eyes daily.    . travoprost, benzalkonium, (TRAVATAN) 0.004 % ophthalmic solution 1 drop at bedtime.    Marland Kitchen amLODipine (NORVASC) 5 MG tablet Take by mouth.    . benazepril (LOTENSIN) 20 MG tablet Take by mouth.     No current facility-administered medications for this encounter.     Physical Findings:  weight is 150 lb 2 oz (68.1 kg). His oral temperature is 97.8 F (36.6 C). His blood pressure is 143/39 (abnormal) and his pulse is 50 (abnormal). His respiration is 18 and oxygen saturation is 99%.  Pain Assessment Pain Score: 0-No pain/10 In general this is a well appearing caucasian male in no acute distress. He's alert and oriented x4 and appropriate throughout the examination. Cardiopulmonary assessment is negative for acute distress and he exhibits normal effort.   Lab Findings: No  results found for: WBC, HGB, HCT, MCV, PLT   Radiographic Findings: Ct Chest W Contrast  Result Date: 01/02/2017 CLINICAL DATA:  Followup right upper lobe lung carcinoma. Status post radiation therapy. EXAM: CT CHEST WITH CONTRAST TECHNIQUE: Multidetector CT imaging of the chest was performed during intravenous contrast administration. CONTRAST:  28mL ISOVUE-300 IOPAMIDOL (ISOVUE-300) INJECTION 61% COMPARISON:  09/25/2016 FINDINGS: Cardiovascular: No acute findings. Stable cardiomegaly. Aortic and coronary artery atherosclerosis. Mediastinum/Nodes: No masses or  pathologically enlarged lymph nodes identified. Lungs/Pleura: 11 x 11 mm pulmonary nodule in the right upper lobe shows no significant change in size. Mild adjacent airspace opacity is consistent with radiation pneumonitis. New 5 mm pulmonary nodule seen in the inferior right lower lobe on image 106/5. This is likely inflammatory in etiology given its short time frame of appearance since recent study 3 months ago. Mild emphysema and biapical scarring again noted. No evidence of pleural effusion. Upper Abdomen:  Unremarkable. Musculoskeletal:  No suspicious bone lesions. IMPRESSION: Stable 11 mm right upper lobe pulmonary nodule. New mild radiation pneumonitis in adjacent right upper lobe. New 5 mm right lower lobe pulmonary nodule, likely inflammatory in etiology. Recommend continued attention on follow-up CT. No evidence of lymphadenopathy or pleural effusion. Aortic Atherosclerosis (ICD10-I70.0) and Emphysema (ICD10-J43.9). Electronically Signed   By: Earle Gell M.D.   On: 01/02/2017 15:58    Impression/Plan: 1. 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 The patient appears to have recovered well from the effects of radiotherapy.  His recent CT chest indicates disease stability.  There is a new 5 mm right lower lobe pulmonary nodule, likely inflammatory in etiology, that we will follow closely on repeat scan in 3 months.  We will see him back in the office following his scan to review results.  He and his friend, Rebekah Chesterfield appear to have a good understanding of this plan and are comfortable with it.  They know to call with any questions or concerns related to his radiotherapy in the interim.    [name], PA-C

## 2017-02-12 ENCOUNTER — Ambulatory Visit (INDEPENDENT_AMBULATORY_CARE_PROVIDER_SITE_OTHER): Payer: Medicare Other | Admitting: *Deleted

## 2017-02-12 DIAGNOSIS — Z23 Encounter for immunization: Secondary | ICD-10-CM

## 2017-04-03 ENCOUNTER — Telehealth: Payer: Self-pay | Admitting: *Deleted

## 2017-04-03 NOTE — Telephone Encounter (Signed)
CALLED PATIENT TO INFORM OF STAT LABS ON 04-18-17 @ 10:45 AM @ [place] AND HIS CT ON 04-18-17- ARRIVAL TIME - 11:45 AM @ [facility], PT. TO BE  NPO- 4 HRS. PRIOR TO TEST, FU APPT. ON 04-19-17 @ 1:30 PM FOR RESULTS WITH ASHLY[facility] BRUNING, SPOKE WITH GWEN GROGAN AND SHE IS AWARE OF THESE APPTS.

## 2017-04-04 ENCOUNTER — Inpatient Hospital Stay
Admission: RE | Admit: 2017-04-04 | Discharge: 2017-04-04 | Disposition: A | Discharge status: Home / Self Care | Payer: Self-pay | Source: Ambulatory Visit | Attending: Urology | Admitting: Urology

## 2017-04-08 NOTE — Progress Notes (Signed)
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  Weight changes, if any:  Wt Readings from Last 3 Encounters:  04/19/17 150 lb 3.2 oz (68.1 kg)  01/03/17 150 lb 2 oz (68.1 kg)  09/11/16 151 lb 6.4 oz (68.7 kg)   Respiratory complaints, if any:  Denies SOB,coughing or wheezing Hemoptysis, if any:   No Swallowing Problems/Pain/Difficulty swallowing: No Appetite : Good Pain: No BP (!) 137/50 (BP Location: Right Arm, Patient Position: Sitting, Cuff Size: Normal)   Pulse (!) 57   Temp 97.7 F (36.5 C) (Oral)   Resp 18   Ht 5\' 6"  (1.676 m)   Wt 150 lb 3.2 oz (68.1 kg)   SpO2 97%   BMI 24.24 kg/m

## 2017-04-18 ENCOUNTER — Other Ambulatory Visit: Payer: Self-pay

## 2017-04-18 ENCOUNTER — Ambulatory Visit
Admission: RE | Admit: 2017-04-18 | Discharge: 2017-04-18 | Disposition: A | Discharge status: Home / Self Care | Payer: Medicare Other | Source: Ambulatory Visit | Attending: Urology | Admitting: Urology

## 2017-04-18 ENCOUNTER — Ambulatory Visit (HOSPITAL_COMMUNITY)
Admission: RE | Admit: 2017-04-18 | Discharge: 2017-04-18 | Disposition: A | Discharge status: Home / Self Care | Payer: Medicare Other | Source: Ambulatory Visit | Attending: Urology | Admitting: Urology

## 2017-04-18 DIAGNOSIS — Z79899 Other long term (current) drug therapy: Secondary | ICD-10-CM | POA: Insufficient documentation

## 2017-04-18 DIAGNOSIS — R590 Localized enlarged lymph nodes: Secondary | ICD-10-CM | POA: Diagnosis not present

## 2017-04-18 DIAGNOSIS — M069 Rheumatoid arthritis, unspecified: Secondary | ICD-10-CM | POA: Insufficient documentation

## 2017-04-18 DIAGNOSIS — Z923 Personal history of irradiation: Secondary | ICD-10-CM | POA: Insufficient documentation

## 2017-04-18 DIAGNOSIS — D689 Coagulation defect, unspecified: Secondary | ICD-10-CM | POA: Diagnosis not present

## 2017-04-18 DIAGNOSIS — Z8546 Personal history of malignant neoplasm of prostate: Secondary | ICD-10-CM | POA: Insufficient documentation

## 2017-04-18 DIAGNOSIS — I700 Atherosclerosis of aorta: Secondary | ICD-10-CM | POA: Insufficient documentation

## 2017-04-18 DIAGNOSIS — J439 Emphysema, unspecified: Secondary | ICD-10-CM | POA: Insufficient documentation

## 2017-04-18 DIAGNOSIS — C3490 Malignant neoplasm of unspecified part of unspecified bronchus or lung: Secondary | ICD-10-CM | POA: Insufficient documentation

## 2017-04-18 DIAGNOSIS — C3411 Malignant neoplasm of upper lobe, right bronchus or lung: Secondary | ICD-10-CM | POA: Diagnosis present

## 2017-04-18 DIAGNOSIS — Z7982 Long term (current) use of aspirin: Secondary | ICD-10-CM | POA: Insufficient documentation

## 2017-04-18 DIAGNOSIS — K8020 Calculus of gallbladder without cholecystitis without obstruction: Secondary | ICD-10-CM | POA: Diagnosis not present

## 2017-04-18 LAB — COMPREHENSIVE METABOLIC PANEL
ALBUMIN: 4 g/dL (ref 3.5–5.0)
ALK PHOS: 73 U/L (ref 40–150)
ALT: 15 U/L (ref 0–55)
AST: 18 U/L (ref 5–34)
Anion Gap: 8 mEq/L (ref 3–11)
BILIRUBIN TOTAL: 0.79 mg/dL (ref 0.20–1.20)
BUN: 21.5 mg/dL (ref 7.0–26.0)
CO2: 28 mEq/L (ref 22–29)
Calcium: 9.4 mg/dL (ref 8.4–10.4)
Chloride: 106 mEq/L (ref 98–109)
Creatinine: 1.1 mg/dL (ref 0.7–1.3)
EGFR: 59 mL/min/{1.73_m2} — ABNORMAL LOW (ref >60)
GLUCOSE: 90 mg/dL (ref 70–140)
Potassium: 4.6 mEq/L (ref 3.5–5.1)
SODIUM: 142 meq/L (ref 136–145)
TOTAL PROTEIN: 6.8 g/dL (ref 6.4–8.3)

## 2017-04-18 MED ORDER — IOPAMIDOL (ISOVUE-300) INJECTION 61%
INTRAVENOUS | Status: AC
  Filled 2017-04-18: qty 100

## 2017-04-18 MED ORDER — IOPAMIDOL (ISOVUE-300) INJECTION 61%
75.0000 mL | Freq: Once | INTRAVENOUS | Status: AC | PRN
  Administered 2017-04-18: 75 mL via INTRAVENOUS

## 2017-04-19 ENCOUNTER — Encounter: Payer: Self-pay | Admitting: Urology

## 2017-04-19 ENCOUNTER — Ambulatory Visit
Admission: RE | Admit: 2017-04-19 | Discharge: 2017-04-19 | Disposition: A | Discharge status: Home / Self Care | Payer: Medicare Other | Source: Ambulatory Visit | Attending: Urology | Admitting: Urology

## 2017-04-19 ENCOUNTER — Other Ambulatory Visit: Payer: Self-pay

## 2017-04-19 VITALS — BP 137/50 | HR 57 | Temp 97.7°F | Resp 18 | Ht 66.0 in | Wt 150.2 lb

## 2017-04-19 DIAGNOSIS — C3411 Malignant neoplasm of upper lobe, right bronchus or lung: Secondary | ICD-10-CM

## 2017-04-19 NOTE — Addendum Note (Signed)
Encounter addended by: Malena Edman, RN on: 04/19/2017 3:12 PM  Actions taken: Charge Capture section accepted

## 2017-04-19 NOTE — Progress Notes (Signed)
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 Prostate and seminal vesicles were treated to 50.4 Gy in 28 fractions of 1.8 Gy.  2. The prostate plus a small margin was boosted to 68.4 Gy with 9 additional fractions at 2 Gy.  3. The prostate plus a small margin, more completely excluding the rectum, was boosted to 76.4 Gy with 4  additional fractions of 2 Gy.   Narrative:  The patient returns today for routine follow-up and to review the results of his recent surveillance CT imaging.   He presents today, with his friend, Rebekah Chesterfield.  Recent CT Chest performed on 04/18/17 reveals interval decrease in size of the treated right upper lobe pulmonary nodule (8 x 7.5 mm versus 11 x 10.5 mm previously). There is progressive surrounding radiation fibrosis and a stable 10 mm right hilar lymph node. The 5 mm right lower lobe pulmonary nodule identified on the prior CT scan has resolved and was likely inflammatory. No new pulmonary lesions. No evidence of lymphadenopathy or pleural effusion.             His friend and caregiver, Rebekah Chesterfield, has recently been diagnosed with RA and inflammatory bowel disease.  She recently had a partial bowel resection and has recovered well.  She continues to struggle with RA due to inability to take NSAIDs or DMARDS due to her coagulopathy requiring chronic anticoagulation.  Mr. Skelton continues to be a big help in assisting her when she is in pain and she continues to look after and care for him regularly.      On review of systems, the patient states that he is doing well and remains without  complaints. He denies any productive cough, hemoptysis, fever, chills, shortness of breath or increased dyspnea on exertion. He reports a healthy appetite and is maintaining his weight. He denies any dysphasia, nausea, vomiting, constipation, diarrhea or abdominal pain. He denies lightheadedness, dizziness or increased fatigue. He remains active around the house.  ALLERGIES:  has No Known Allergies.  Meds: Current Outpatient Medications  Medication Sig Dispense Refill  . allopurinol (ZYLOPRIM) 300 MG tablet Take 300 mg by mouth daily.    Marland Kitchen amLODipine (NORVASC) 5 MG tablet Take by mouth.    Marland Kitchen aspirin EC 81 MG tablet Take 81 mg by mouth daily.    Marland Kitchen atenolol (TENORMIN) 25 MG tablet Take 12.5 mg by mouth daily.    . benazepril (LOTENSIN) 20 MG tablet Take 20 mg by mouth daily.    . timolol (TIMOPTIC) 0.5 % ophthalmic solution Place 1 drop into both eyes daily.    . travoprost, benzalkonium, (TRAVATAN) 0.004 % ophthalmic solution 1 drop at bedtime.     No current facility-administered medications for this encounter.     Physical Findings:  height is 5\' 6"  (1.676 m) and weight is 150 lb 3.2 oz (68.1 kg). His oral temperature is 97.7 F (36.5 C). His blood pressure is 137/50 (abnormal) and his pulse is 57 (abnormal). His respiration is 18 and oxygen saturation is 97%.  Pain Assessment Pain Score: 0-No pain/10  In general this is a well appearing caucasian male in no acute distress. He's alert and oriented x4 and appropriate throughout the examination. Cardiopulmonary assessment is negative for acute distress and he exhibits normal effort.   Lab Findings: No results found for: WBC, HGB, HCT, MCV, PLT   Radiographic Findings: Ct Chest W Contrast  Result Date: 04/18/2017 CLINICAL DATA:  Restaging lung cancer. EXAM: CT CHEST WITH CONTRAST TECHNIQUE: Multidetector CT imaging of the chest was performed during intravenous contrast administration. CONTRAST:  36mL ISOVUE-300 IOPAMIDOL (ISOVUE-300)  INJECTION 61% COMPARISON:  01/02/2017 FINDINGS: Cardiovascular: The heart is within normal limits in size and stable. No pericardial effusion. Stable tortuosity, ectasia and calcification of the thoracic aorta. Stable three-vessel coronary artery calcifications. The pulmonary arteries appear normal. Mediastinum/Nodes: Stable 10 mm right hilar lymph node on image number 57. Node mediastinal lymphadenopathy. The esophagus is grossly normal. Lungs/Pleura: Interval decrease in size of the right upper lobe pulmonary nodule. It previously measured 11 x 10.5 mm and now measures 8 x 7.5 mm. Surrounding radiation changes are more pronounced. The right lower lobe pulmonary nodule demonstrated on the prior CT scan has resolved and most likely inflammatory. No new pulmonary lesions to suggest pulmonary metastatic disease. Stable emphysematous changes and biapical pleural-parenchymal scarring. No infiltrates or effusions. Upper Abdomen: No significant upper abdominal findings. No hepatic or adrenal gland lesions. Stable vague low-attenuation lesion in the left hepatic lobe on image number 121 which is most likely a benign cyst. Numerous small layering gallstones are noted in the gallbladder. Advanced atherosclerotic calcifications involving the upper abdominal aorta and branch vessels. Musculoskeletal: No chest wall mass, supraclavicular or axillary lymphadenopathy. The thyroid gland appears normal. No worrisome bone lesions to suggest osseous metastatic disease. Findings suspicious for ankylosing spondylitis or DISH in the thoracic spine. IMPRESSION: 1. Interval decrease in size of the right upper lobe pulmonary nodule (8 x 7.5 mm versus 11 x 10.5 mm). Progressive surrounding radiation fibrosis. 2. Stable 10 mm right hilar lymph node. 3. The 5 mm right lower lobe pulmonary nodule identified on the prior CT scan has resolved and was likely inflammatory. No new pulmonary lesions. 4. No findings to suggest upper abdominal  metastatic disease. 5. Cholelithiasis. Aortic Atherosclerosis (ICD10-I70.0) and Emphysema (ICD10-J43.9). Electronically Signed   By: Marijo Sanes M.D.   On: 04/18/2017 14:47    Impression/Plan: 1. 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 The patient has recovered well from the effects of radiotherapy.  His recent CT chest indicates disease stability.  At this point, we will continue with close monitoring with Chest CT q 6 months and will see him in the office after each scan to review results. He and his friend, Rebekah Chesterfield appear to have a good understanding of this plan and are comfortable with it.  They know to call with any questions or concerns related to his radiotherapy in the interim.    [name], PA-C

## 2017-10-16 ENCOUNTER — Ambulatory Visit: Payer: Self-pay | Admitting: Urology

## 2017-10-23 ENCOUNTER — Ambulatory Visit: Payer: Self-pay | Admitting: Urology

## 2017-10-29 ENCOUNTER — Ambulatory Visit: Payer: Self-pay | Admitting: Urology

## 2017-11-12 ENCOUNTER — Telehealth: Payer: Self-pay | Admitting: *Deleted

## 2017-11-12 NOTE — Telephone Encounter (Signed)
CALLED PATIENT TO INFORM OF STAT LABS ON 12-05-17 @ 12:45 PM @ [place] HIS CT ON 12-05-17 - ARRIVAL TIME - 1:45 PM @ [facility], PT. TO HAVE WATER ONLY - 4 HRS. PRIOR TO TEST, PT. TO GET RESULTS FROM [facility] BRUNING ON 12-06-17, SPOKE WITH PATIENT'S (SGO)- GWEN GROGAN AND SHE IS AWARE OF THESE APPTS.

## 2017-11-12 NOTE — Telephone Encounter (Signed)
CALLED PATIENT TO INFORM OF STAT LABS ON 11-14-17 AND CT FOR 11-14-17, PT. TO HAVE WATER ONLY- 4 HRS. PRIOR TO TEST, PATIENT TO GET RESULTS FROM ASHLYN BRUNING ON 11-15-17, LVM FOR A RETURN CALL

## 2017-11-14 ENCOUNTER — Ambulatory Visit (HOSPITAL_COMMUNITY): Payer: Medicare Other

## 2017-11-14 ENCOUNTER — Ambulatory Visit: Payer: Medicare Other

## 2017-11-15 ENCOUNTER — Ambulatory Visit
Admission: RE | Admit: 2017-11-15 | Discharge: 2017-11-15 | Disposition: A | Discharge status: Home / Self Care | Payer: Medicare Other | Source: Ambulatory Visit | Attending: Urology | Admitting: Urology

## 2017-12-05 ENCOUNTER — Ambulatory Visit (HOSPITAL_COMMUNITY)
Admission: RE | Admit: 2017-12-05 | Discharge: 2017-12-05 | Disposition: A | Discharge status: Home / Self Care | Payer: Medicare Other | Source: Ambulatory Visit | Attending: Urology | Admitting: Urology

## 2017-12-05 ENCOUNTER — Other Ambulatory Visit: Payer: Self-pay | Admitting: Radiation Oncology

## 2017-12-05 ENCOUNTER — Ambulatory Visit
Admission: RE | Admit: 2017-12-05 | Discharge: 2017-12-05 | Disposition: A | Discharge status: Home / Self Care | Payer: Medicare Other | Source: Ambulatory Visit | Attending: Urology | Admitting: Urology

## 2017-12-05 DIAGNOSIS — K8020 Calculus of gallbladder without cholecystitis without obstruction: Secondary | ICD-10-CM | POA: Insufficient documentation

## 2017-12-05 DIAGNOSIS — C3411 Malignant neoplasm of upper lobe, right bronchus or lung: Secondary | ICD-10-CM

## 2017-12-05 DIAGNOSIS — I700 Atherosclerosis of aorta: Secondary | ICD-10-CM | POA: Insufficient documentation

## 2017-12-05 DIAGNOSIS — J439 Emphysema, unspecified: Secondary | ICD-10-CM | POA: Insufficient documentation

## 2017-12-05 LAB — COMPREHENSIVE METABOLIC PANEL
ALBUMIN: 4.1 g/dL (ref 3.5–5.0)
ALK PHOS: 77 U/L (ref 38–126)
ALT: 14 U/L (ref 0–44)
AST: 17 U/L (ref 15–41)
Anion gap: 8 (ref 5–15)
BILIRUBIN TOTAL: 0.6 mg/dL (ref 0.3–1.2)
BUN: 16 mg/dL (ref 8–23)
CALCIUM: 9.6 mg/dL (ref 8.9–10.3)
CO2: 26 mmol/L (ref 22–32)
CREATININE: 0.89 mg/dL (ref 0.61–1.24)
Chloride: 105 mmol/L (ref 98–111)
GFR calc Af Amer: >60 mL/min (ref >60)
GFR calc non Af Amer: >60 mL/min (ref >60)
GLUCOSE: 92 mg/dL (ref 70–99)
POTASSIUM: 4.4 mmol/L (ref 3.5–5.1)
Sodium: 139 mmol/L (ref 135–145)
TOTAL PROTEIN: 7 g/dL (ref 6.5–8.1)

## 2017-12-05 MED ORDER — IOHEXOL 300 MG/ML  SOLN
75.0000 mL | Freq: Once | INTRAMUSCULAR | Status: AC | PRN
  Administered 2017-12-05: 75 mL via INTRAVENOUS

## 2017-12-06 ENCOUNTER — Ambulatory Visit
Admission: RE | Admit: 2017-12-06 | Discharge: 2017-12-06 | Disposition: A | Discharge status: Home / Self Care | Payer: Medicare Other | Source: Ambulatory Visit | Attending: Urology | Admitting: Urology

## 2017-12-06 ENCOUNTER — Encounter: Payer: Self-pay | Admitting: Urology

## 2017-12-06 VITALS — BP 141/46 | HR 55 | Temp 97.9°F | Resp 20 | Ht 66.0 in | Wt 145.8 lb

## 2017-12-06 DIAGNOSIS — C3411 Malignant neoplasm of upper lobe, right bronchus or lung: Secondary | ICD-10-CM

## 2017-12-06 NOTE — Progress Notes (Signed)
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  Weight changes, if OYW:VXUC a few pounds, changed up eating habits. Respiratory complaints, if any:no complaints Hemoptysis, if any:a tickle every once in a while. Swallowing Problems/Pain/Difficulty swallowing:No Appetite :pretty good Pain:None  BP (!) 141/46 (BP Location: Right Arm, Patient Position: Sitting)   Pulse (!) 55   Temp 97.9 F (36.6 C) (Oral)   Resp 20   Ht 5\' 6"  (1.676 m)   Wt 145 lb 12.8 oz (66.1 kg)   SpO2 97%   BMI 23.53 kg/m    Wt Readings from Last 3 Encounters:  12/06/17 145 lb 12.8 oz (66.1 kg)  04/19/17 150 lb 3.2 oz (68.1 kg)  01/03/17 150 lb 2 oz (68.1 kg)    M. Leonie Green, BSN

## 2017-12-06 NOTE — Progress Notes (Signed)
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 Prostate and seminal vesicles were treated to 50.4 Gy in 28 fractions of 1.8 Gy.  2. The prostate plus a small margin was boosted to 68.4 Gy with 9 additional fractions at 2 Gy.  3. The prostate plus a small margin, more completely excluding the rectum, was boosted to 76.4 Gy with 4 additional fractions of 2 Gy.   Narrative:  The patient returns today for routine follow-up and to review the results of his recent surveillance CT imaging.   He presents today by himself.  Recent CT Chest performed on 12/05/17 reveals a stable 7 mm right upper lobe nodule with associated adjacent radiation fibrosis, a stable 1 cm right hilar lymph node and no new nodules identified.           His friend and caregiver, Rebekah Chesterfield, has recently been diagnosed with RA and inflammatory bowel disease.  She recently had a partial bowel resection and has recovered well.  She continues to struggle with RA due to inability to take NSAIDs or DMARDS due to her coagulopathy requiring chronic anticoagulation.  Robert Perry continues to be a big help in assisting her when she is in pain and she continues to look after and care for him regularly.      On review of systems, the patient states that he is doing well and remains without complaints. He denies any productive cough, hemoptysis, fever, chills, shortness of breath or increased dyspnea on exertion. He reports a healthy appetite and is maintaining his weight. He denies any dysphasia, nausea, vomiting, constipation, diarrhea or abdominal  pain. He denies lightheadedness, dizziness or increased fatigue. He remains active around the house.  ALLERGIES:  has No Known Allergies.  Meds: Current Outpatient Medications  Medication Sig Dispense Refill  . allopurinol (ZYLOPRIM) 300 MG tablet Take 300 mg by mouth daily.    Marland Kitchen amLODipine (NORVASC) 5 MG tablet Take by mouth.    Marland Kitchen aspirin EC 81 MG tablet Take 81 mg by mouth daily.    Marland Kitchen atenolol (TENORMIN) 25 MG tablet Take 12.5 mg by mouth daily.    . benazepril (LOTENSIN) 20 MG tablet Take 20 mg by mouth daily.    . Latanoprost 0.005 % EMUL PLACE 1 DROP INTO EACH EYE NIGHTLY    . timolol (TIMOPTIC) 0.5 % ophthalmic solution Place 1 drop into both eyes daily.    . travoprost, benzalkonium, (TRAVATAN) 0.004 % ophthalmic solution 1 drop at bedtime.     No current facility-administered medications for this encounter.     Physical Findings:  height is 5\' 6"  (1.676 m) and weight is 145 lb 12.8 oz (66.1 kg). His oral temperature is 97.9 F (36.6 C). His blood pressure is 141/46 (abnormal) and his pulse is 55 (abnormal). His respiration is 20 and oxygen saturation is 97%.   /10 In general this is a well appearing caucasian male in no acute distress. He's alert and oriented x4 and appropriate throughout the examination. Cardiopulmonary assessment is negative for acute distress and he exhibits normal  effort.   Lab Findings: No results found for: WBC, HGB, HCT, MCV, PLT   Radiographic Findings: Ct Chest W Contrast  Result Date: 12/05/2017 CLINICAL DATA:  Restaging lung cancer. EXAM: CT CHEST WITH CONTRAST TECHNIQUE: Multidetector CT imaging of the chest was performed during intravenous contrast administration. CONTRAST:  68mL OMNIPAQUE IOHEXOL 300 MG/ML  SOLN COMPARISON:  04/18/2017 FINDINGS: Cardiovascular: Mild cardiomegaly. Calcified plaque over the left main, left anterior descending and right coronary arteries. Calcified plaque over the thoracic aorta. Pulmonary arteries are within  normal. Mediastinum/Nodes: No mediastinal adenopathy. Stable 1 cm right hilar lymph node. Remaining mediastinal structures are normal. Lungs/Pleura: Lungs are adequately inflated without airspace consolidation or effusion. Stable biapical pleural thickening. Mild centrilobular emphysematous disease. There is no significant change in a 7 x 7 mm nodule over the right upper lobe with adjacent radiation fibrosis. No additional nodules identified. Airways are within normal. There is a focus of opacification over the right lateral wall of the distal trachea likely aspirate material. Upper Abdomen: 3 small subcentimeter hypodensities over the left lobe of the liver unchanged. Cholelithiasis. Adrenal glands are normal. No acute findings. Calcified plaque over the abdominal aorta. Musculoskeletal: Degenerative change of the spine. IMPRESSION: Stable 7 mm right upper lobe nodule with associated adjacent radiation fibrosis. No new nodules identified. Stable 1 cm right hilar lymph node. Focus of aspirate material over the right lateral wall of the distal trachea. Aortic Atherosclerosis (ICD10-I70.0) and Emphysema (ICD10-J43.9). Three small subcentimeter left liver hypodensities unchanged. Cholelithiasis. Electronically Signed   By: Marin Olp M.D.   On: 12/05/2017 17:22    Impression/Plan: 1. 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 The patient continues to remain stable both clinically and radiographically.  His recent CT chest indicates disease stability and he is without complaints and able to remain active.  At this point, we will continue with close monitoring with Chest CT q 6 months and will see him in the office after each scan to review results. He appears to have a good understanding of this plan and is comfortable with it.  He knows to call with any questions or concerns related to his radiotherapy in the interim.    [name], PA-C  This document serves as a record of services  personally performed by [employer], PA-C. It was created on her behalf by Wilburn Mylar, a trained medical scribe. The creation of this record is based on the scribe's personal observations and the provider's statements to them. This document has been checked and approved by the attending provider.

## 2020-03-26 IMAGING — CT CT CHEST W/ CM
2 of 4 series · 15 of 36 positions shown, 18 images · IV contrast (OMNIPAQUE)
Comparison: 04/18/2017

CLINICAL DATA: Restaging lung cancer.

EXAM:
CT CHEST WITH CONTRAST
TECHNIQUE: Multidetector CT imaging of the chest was performed during
intravenous contrast administration.
CONTRAST:  75mL OMNIPAQUE IOHEXOL 300 MG/ML  SOLN

[Series 2: axial st · axial · 0.76mm/px · z∈[-70,+190]mm · 12 of 152 slices shown, 15 images]
[im 11/152  mediastinal]
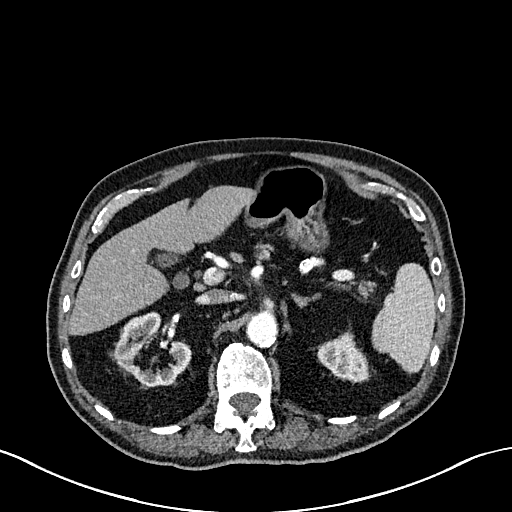
[im 11/152  lung]
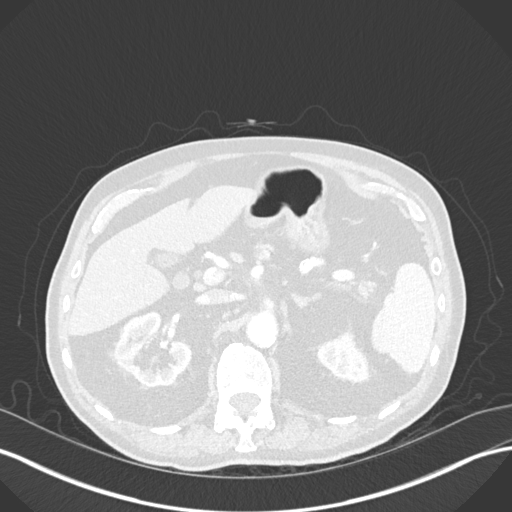
[im 22/152  lung]
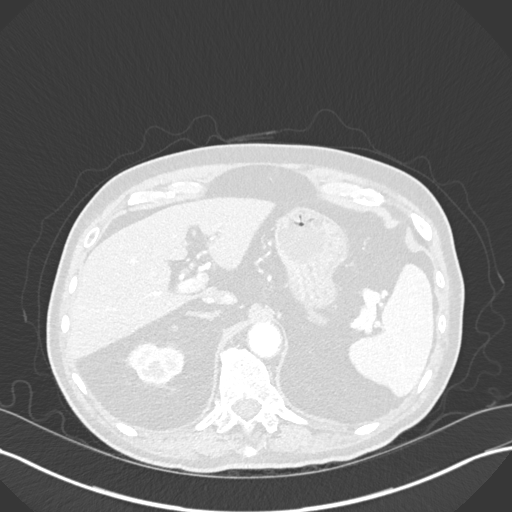
[im 33/152  lung]
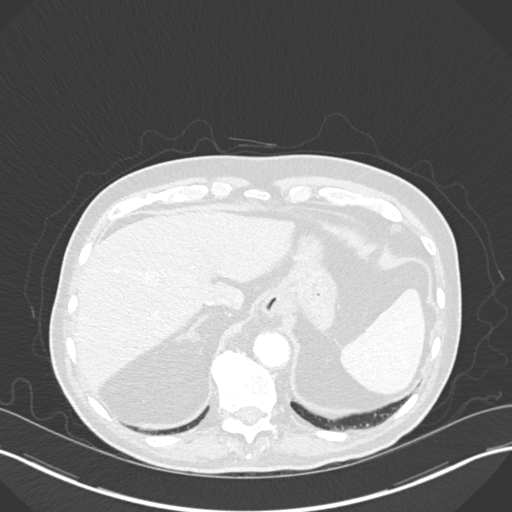
[im 44/152  lung]
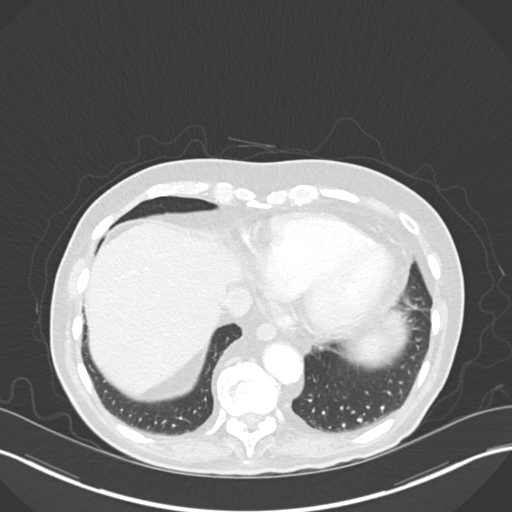
[im 54/152  mediastinal]
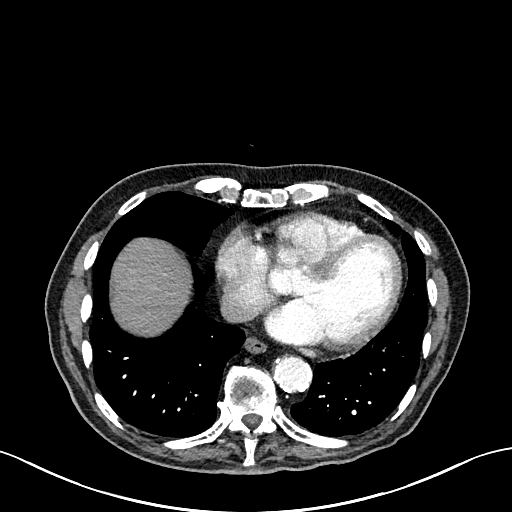
[im 54/152  lung]
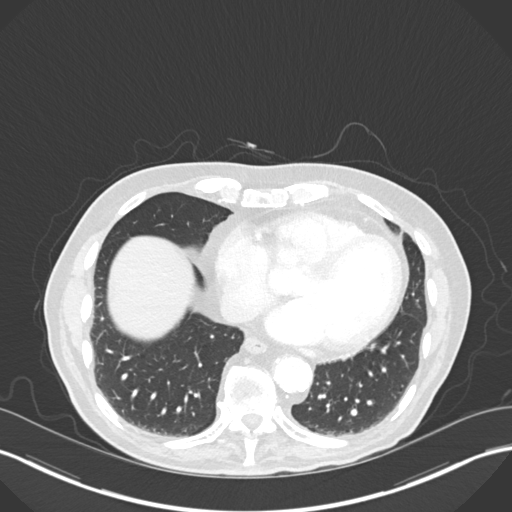
[im 65/152  lung]
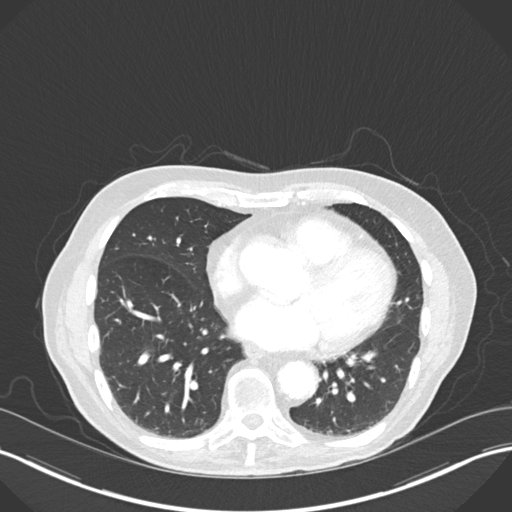
[im 87/152  lung]
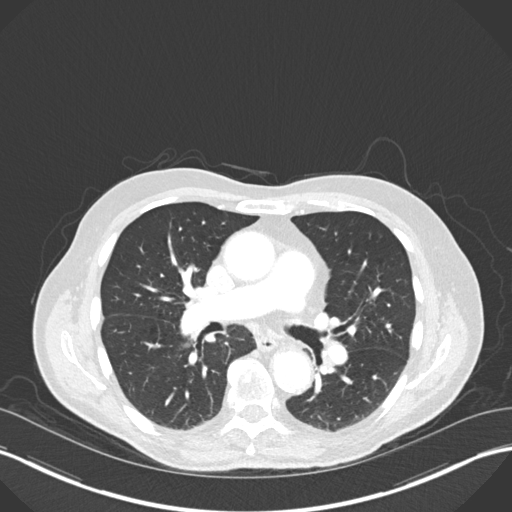
[im 98/152  lung]
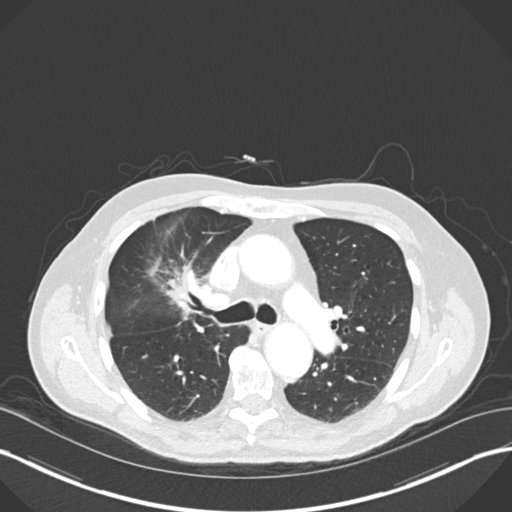
[im 108/152  mediastinal]
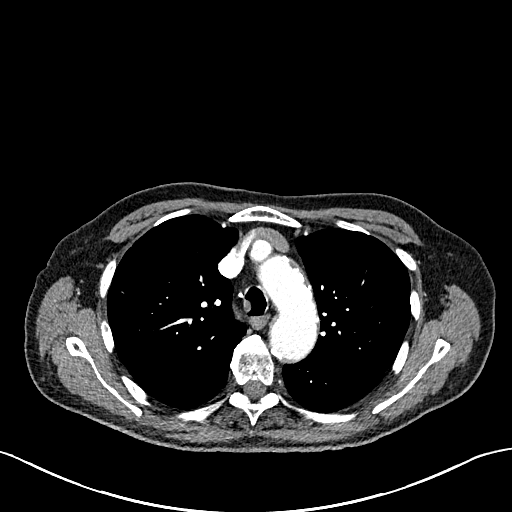
[im 108/152  lung]
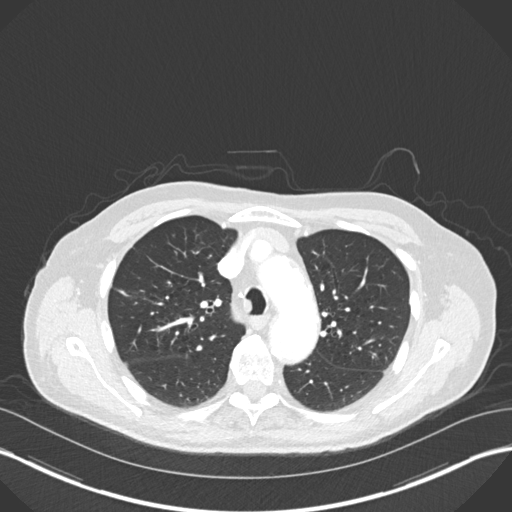
[im 119/152  lung]
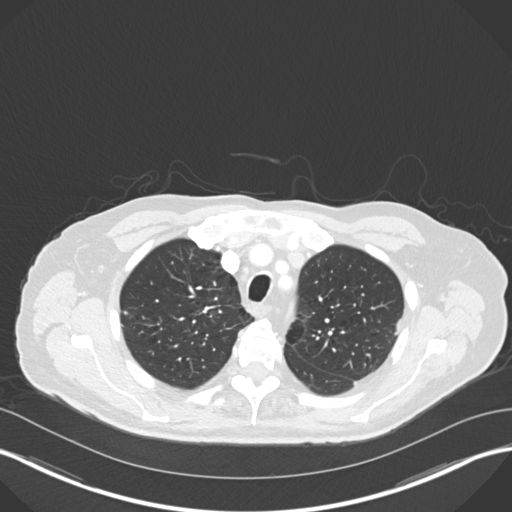
[im 130/152  lung]
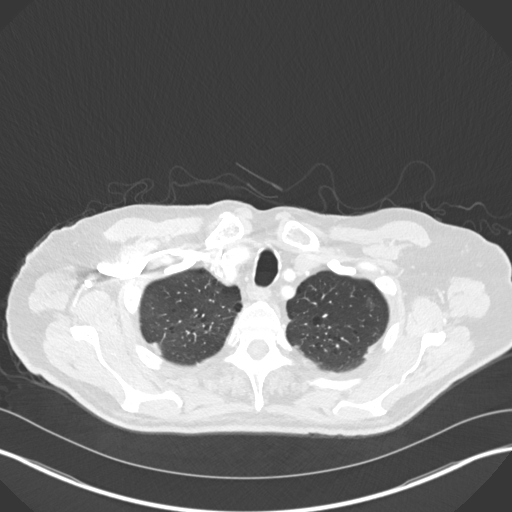
[im 141/152  lung]
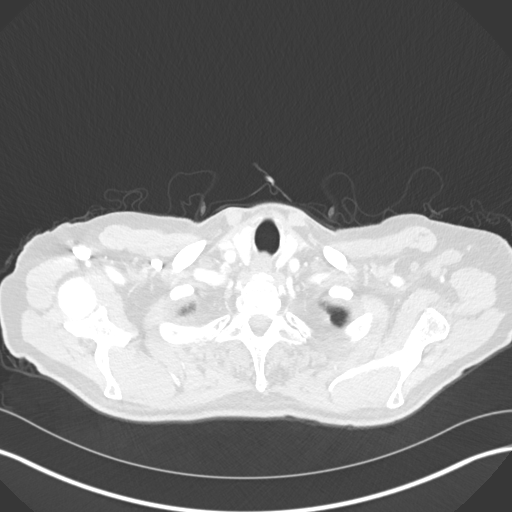

[Series 5: coronal · coronal · 0.67mm/px · 3 of 126 slices shown]
[im 26/126  lung]
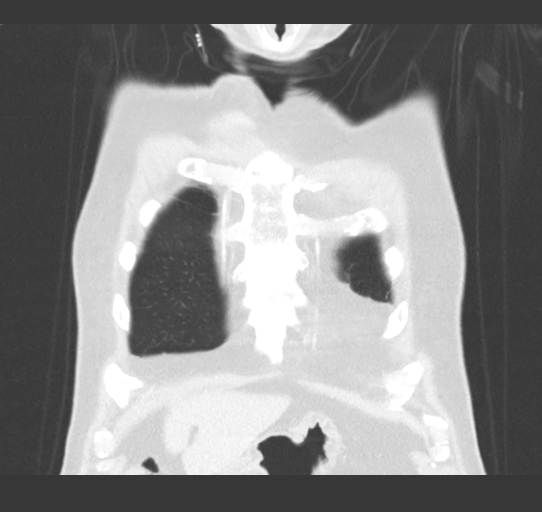
[im 51/126  lung]
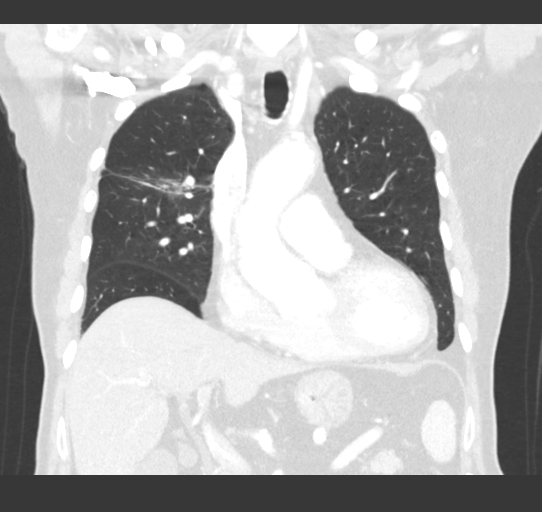
[im 76/126  lung]
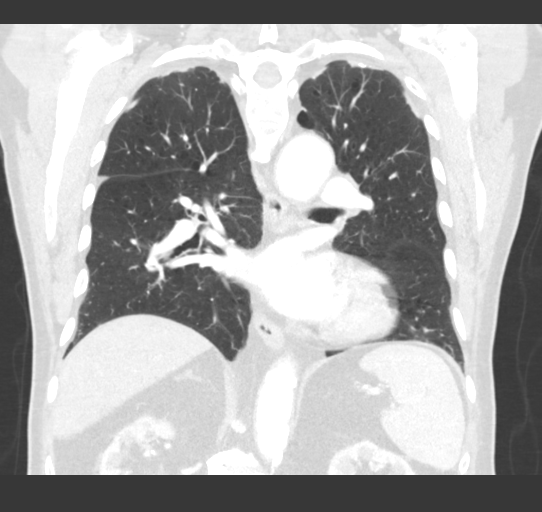

[15 of 36 positions shown; findings below may reference images not displayed]

FINDINGS: Cardiovascular: Mild cardiomegaly. Calcified plaque over the left
main, left anterior descending and right coronary arteries.
Calcified plaque over the thoracic aorta. Pulmonary arteries are
within normal.

Mediastinum/Nodes: No mediastinal adenopathy. Stable 1 cm right
hilar lymph node. Remaining mediastinal structures are normal.

Lungs/Pleura: Lungs are adequately inflated without airspace
consolidation or effusion. Stable biapical pleural thickening. Mild
centrilobular emphysematous disease. There is no significant change
in a 7 x 7 mm nodule over the right upper lobe with adjacent
radiation fibrosis. No additional nodules identified. Airways are
within normal. There is a focus of opacification over the right
lateral wall of the distal trachea likely aspirate material.

Upper Abdomen: 3 small subcentimeter hypodensities over the left
lobe of the liver unchanged. Cholelithiasis. Adrenal glands are
normal. No acute findings. Calcified plaque over the abdominal
aorta.

Musculoskeletal: Degenerative change of the spine.
IMPRESSION: Stable 7 mm right upper lobe nodule with associated adjacent
radiation fibrosis. No new nodules identified. Stable 1 cm right
hilar lymph node.

Focus of aspirate material over the right lateral wall of the distal
trachea.

Aortic Atherosclerosis (2VAM2-VJN.N) and Emphysema (2VAM2-MTQ.K).

Three small subcentimeter left liver hypodensities unchanged.

Cholelithiasis.

## 2022-11-10 ENCOUNTER — Inpatient Hospital Stay (HOSPITAL_COMMUNITY)
Admission: EM | Admit: 2022-11-10 | Discharge: 2022-11-15 | DRG: 871 | Disposition: E | Payer: Medicare Other | Attending: Pulmonary Disease | Admitting: Pulmonary Disease

## 2022-11-10 ENCOUNTER — Emergency Department (HOSPITAL_COMMUNITY): Payer: Medicare Other

## 2022-11-10 ENCOUNTER — Encounter (HOSPITAL_COMMUNITY): Payer: Self-pay

## 2022-11-10 ENCOUNTER — Other Ambulatory Visit: Payer: Self-pay

## 2022-11-10 ENCOUNTER — Inpatient Hospital Stay: Admit: 2022-11-10 | Payer: Medicare Other | Admitting: Internal Medicine

## 2022-11-10 ENCOUNTER — Inpatient Hospital Stay (HOSPITAL_COMMUNITY): Payer: Medicare Other

## 2022-11-10 ENCOUNTER — Encounter (HOSPITAL_COMMUNITY): Payer: Self-pay | Admitting: *Deleted

## 2022-11-10 DIAGNOSIS — Z66 Do not resuscitate: Secondary | ICD-10-CM | POA: Diagnosis not present

## 2022-11-10 DIAGNOSIS — I462 Cardiac arrest due to underlying cardiac condition: Secondary | ICD-10-CM | POA: Diagnosis present

## 2022-11-10 DIAGNOSIS — Z85828 Personal history of other malignant neoplasm of skin: Secondary | ICD-10-CM

## 2022-11-10 DIAGNOSIS — Z8546 Personal history of malignant neoplasm of prostate: Secondary | ICD-10-CM | POA: Diagnosis not present

## 2022-11-10 DIAGNOSIS — Z85118 Personal history of other malignant neoplasm of bronchus and lung: Secondary | ICD-10-CM

## 2022-11-10 DIAGNOSIS — R7989 Other specified abnormal findings of blood chemistry: Secondary | ICD-10-CM | POA: Diagnosis present

## 2022-11-10 DIAGNOSIS — E872 Acidosis, unspecified: Secondary | ICD-10-CM | POA: Diagnosis present

## 2022-11-10 DIAGNOSIS — Z8249 Family history of ischemic heart disease and other diseases of the circulatory system: Secondary | ICD-10-CM | POA: Diagnosis not present

## 2022-11-10 DIAGNOSIS — M109 Gout, unspecified: Secondary | ICD-10-CM | POA: Diagnosis present

## 2022-11-10 DIAGNOSIS — J9601 Acute respiratory failure with hypoxia: Secondary | ICD-10-CM | POA: Diagnosis present

## 2022-11-10 DIAGNOSIS — I21A1 Myocardial infarction type 2: Secondary | ICD-10-CM | POA: Diagnosis present

## 2022-11-10 DIAGNOSIS — Z87891 Personal history of nicotine dependence: Secondary | ICD-10-CM

## 2022-11-10 DIAGNOSIS — Z7982 Long term (current) use of aspirin: Secondary | ICD-10-CM

## 2022-11-10 DIAGNOSIS — C3490 Malignant neoplasm of unspecified part of unspecified bronchus or lung: Secondary | ICD-10-CM | POA: Diagnosis present

## 2022-11-10 DIAGNOSIS — I447 Left bundle-branch block, unspecified: Secondary | ICD-10-CM | POA: Diagnosis present

## 2022-11-10 DIAGNOSIS — A419 Sepsis, unspecified organism: Principal | ICD-10-CM | POA: Diagnosis present

## 2022-11-10 DIAGNOSIS — Z79899 Other long term (current) drug therapy: Secondary | ICD-10-CM

## 2022-11-10 DIAGNOSIS — E785 Hyperlipidemia, unspecified: Secondary | ICD-10-CM | POA: Diagnosis present

## 2022-11-10 DIAGNOSIS — I5021 Acute systolic (congestive) heart failure: Secondary | ICD-10-CM | POA: Diagnosis present

## 2022-11-10 DIAGNOSIS — J189 Pneumonia, unspecified organism: Secondary | ICD-10-CM | POA: Diagnosis present

## 2022-11-10 DIAGNOSIS — H4010X0 Unspecified open-angle glaucoma, stage unspecified: Secondary | ICD-10-CM | POA: Diagnosis present

## 2022-11-10 DIAGNOSIS — H409 Unspecified glaucoma: Secondary | ICD-10-CM | POA: Diagnosis present

## 2022-11-10 DIAGNOSIS — Z1152 Encounter for screening for COVID-19: Secondary | ICD-10-CM | POA: Diagnosis not present

## 2022-11-10 DIAGNOSIS — J439 Emphysema, unspecified: Secondary | ICD-10-CM | POA: Diagnosis present

## 2022-11-10 DIAGNOSIS — I110 Hypertensive heart disease with heart failure: Secondary | ICD-10-CM | POA: Diagnosis present

## 2022-11-10 DIAGNOSIS — R6521 Severe sepsis with septic shock: Secondary | ICD-10-CM | POA: Diagnosis present

## 2022-11-10 DIAGNOSIS — Z515 Encounter for palliative care: Secondary | ICD-10-CM | POA: Diagnosis not present

## 2022-11-10 DIAGNOSIS — Z85820 Personal history of malignant melanoma of skin: Secondary | ICD-10-CM

## 2022-11-10 DIAGNOSIS — R570 Cardiogenic shock: Secondary | ICD-10-CM | POA: Diagnosis present

## 2022-11-10 DIAGNOSIS — N179 Acute kidney failure, unspecified: Secondary | ICD-10-CM | POA: Diagnosis present

## 2022-11-10 LAB — BLOOD GAS, VENOUS
Acid-base deficit: 6.2 mmol/L — ABNORMAL HIGH (ref 0.0–2.0)
Bicarbonate: 19.6 mmol/L — ABNORMAL LOW (ref 20.0–28.0)
Drawn by: 27160
O2 Saturation: 25.9 %
Patient temperature: 36.6
pCO2, Ven: 38 mmHg — ABNORMAL LOW (ref 44–60)
pH, Ven: 7.32 (ref 7.25–7.43)
pO2, Ven: <31 mmHg — CL (ref 32–45)

## 2022-11-10 LAB — RESP PANEL BY RT-PCR (RSV, FLU A&B, COVID)  RVPGX2
Influenza A by PCR: NEGATIVE
Influenza B by PCR: NEGATIVE
Resp Syncytial Virus by PCR: NEGATIVE
SARS Coronavirus 2 by RT PCR: NEGATIVE

## 2022-11-10 LAB — CBC WITH DIFFERENTIAL/PLATELET
Abs Immature Granulocytes: 0.08 10*3/uL — ABNORMAL HIGH (ref 0.00–0.07)
Basophils Absolute: 0 10*3/uL (ref 0.0–0.1)
Basophils Relative: 0 %
Eosinophils Absolute: 0 10*3/uL (ref 0.0–0.5)
Eosinophils Relative: 0 %
HCT: 37.1 % — ABNORMAL LOW (ref 39.0–52.0)
Hemoglobin: 12.3 g/dL — ABNORMAL LOW (ref 13.0–17.0)
Immature Granulocytes: 1 %
Lymphocytes Relative: 5 %
Lymphs Abs: 0.5 10*3/uL — ABNORMAL LOW (ref 0.7–4.0)
MCH: 32.5 pg (ref 26.0–34.0)
MCHC: 33.2 g/dL (ref 30.0–36.0)
MCV: 98.1 fL (ref 80.0–100.0)
Monocytes Absolute: 0.6 10*3/uL (ref 0.1–1.0)
Monocytes Relative: 6 %
Neutro Abs: 9.9 10*3/uL — ABNORMAL HIGH (ref 1.7–7.7)
Neutrophils Relative %: 88 %
Platelets: 279 10*3/uL (ref 150–400)
RBC: 3.78 MIL/uL — ABNORMAL LOW (ref 4.22–5.81)
RDW: 13.6 % (ref 11.5–15.5)
WBC: 11.3 10*3/uL — ABNORMAL HIGH (ref 4.0–10.5)
nRBC: 0 % (ref 0.0–0.2)

## 2022-11-10 LAB — GLUCOSE, CAPILLARY
Glucose-Capillary: 159 mg/dL — ABNORMAL HIGH (ref 70–99)
Glucose-Capillary: 141 mg/dL — ABNORMAL HIGH (ref 70–99)

## 2022-11-10 LAB — URINALYSIS, W/ REFLEX TO CULTURE (INFECTION SUSPECTED)
Bacteria, UA: NONE SEEN
Bilirubin Urine: NEGATIVE
Glucose, UA: NEGATIVE mg/dL
Hgb urine dipstick: NEGATIVE
Ketones, ur: 5 mg/dL — AB
Leukocytes,Ua: NEGATIVE
Nitrite: NEGATIVE
Protein, ur: 30 mg/dL — AB
Specific Gravity, Urine: 1.019 (ref 1.005–1.030)
pH: 5 (ref 5.0–8.0)

## 2022-11-10 LAB — LACTIC ACID, PLASMA
Lactic Acid, Venous: 3.1 mmol/L (ref 0.5–1.9)
Lactic Acid, Venous: 4.3 mmol/L (ref 0.5–1.9)
Lactic Acid, Venous: 5.6 mmol/L (ref 0.5–1.9)
Lactic Acid, Venous: >9 mmol/L (ref 0.5–1.9)

## 2022-11-10 LAB — BASIC METABOLIC PANEL
Anion gap: 21 — ABNORMAL HIGH (ref 5–15)
BUN: 45 mg/dL — ABNORMAL HIGH (ref 8–23)
CO2: 11 mmol/L — ABNORMAL LOW (ref 22–32)
Calcium: 8.7 mg/dL — ABNORMAL LOW (ref 8.9–10.3)
Chloride: 103 mmol/L (ref 98–111)
Creatinine, Ser: 2.19 mg/dL — ABNORMAL HIGH (ref 0.61–1.24)
GFR, Estimated: 27 mL/min — ABNORMAL LOW (ref >60)
Glucose, Bld: 167 mg/dL — ABNORMAL HIGH (ref 70–99)
Potassium: 6.2 mmol/L — ABNORMAL HIGH (ref 3.5–5.1)
Sodium: 135 mmol/L (ref 135–145)

## 2022-11-10 LAB — COMPREHENSIVE METABOLIC PANEL
ALT: 25 U/L (ref 0–44)
AST: 42 U/L — ABNORMAL HIGH (ref 15–41)
Albumin: 3.5 g/dL (ref 3.5–5.0)
Alkaline Phosphatase: 56 U/L (ref 38–126)
Anion gap: 12 (ref 5–15)
BUN: 43 mg/dL — ABNORMAL HIGH (ref 8–23)
CO2: 20 mmol/L — ABNORMAL LOW (ref 22–32)
Calcium: 9 mg/dL (ref 8.9–10.3)
Chloride: 103 mmol/L (ref 98–111)
Creatinine, Ser: 1.67 mg/dL — ABNORMAL HIGH (ref 0.61–1.24)
GFR, Estimated: 37 mL/min — ABNORMAL LOW (ref >60)
Glucose, Bld: 200 mg/dL — ABNORMAL HIGH (ref 70–99)
Potassium: 5.1 mmol/L (ref 3.5–5.1)
Sodium: 135 mmol/L (ref 135–145)
Total Bilirubin: 1.3 mg/dL — ABNORMAL HIGH (ref 0.3–1.2)
Total Protein: 6.8 g/dL (ref 6.5–8.1)

## 2022-11-10 LAB — CBC
HCT: 39.3 % (ref 39.0–52.0)
Hemoglobin: 12.2 g/dL — ABNORMAL LOW (ref 13.0–17.0)
MCH: 32.2 pg (ref 26.0–34.0)
MCHC: 31 g/dL (ref 30.0–36.0)
MCV: 103.7 fL — ABNORMAL HIGH (ref 80.0–100.0)
Platelets: 241 10*3/uL (ref 150–400)
RBC: 3.79 MIL/uL — ABNORMAL LOW (ref 4.22–5.81)
RDW: 13.7 % (ref 11.5–15.5)
WBC: 14.8 10*3/uL — ABNORMAL HIGH (ref 4.0–10.5)
nRBC: 0.6 % — ABNORMAL HIGH (ref 0.0–0.2)

## 2022-11-10 LAB — CBG MONITORING, ED: Glucose-Capillary: 177 mg/dL — ABNORMAL HIGH (ref 70–99)

## 2022-11-10 LAB — TROPONIN I (HIGH SENSITIVITY)
Troponin I (High Sensitivity): 2746 ng/L (ref <18)
Troponin I (High Sensitivity): 2785 ng/L (ref <18)

## 2022-11-10 LAB — LIPASE, BLOOD: Lipase: 35 U/L (ref 11–51)

## 2022-11-10 LAB — MRSA NEXT GEN BY PCR, NASAL: MRSA by PCR Next Gen: NOT DETECTED

## 2022-11-10 LAB — APTT: aPTT: 27 seconds (ref 24–36)

## 2022-11-10 LAB — BRAIN NATRIURETIC PEPTIDE: B Natriuretic Peptide: 2402 pg/mL — ABNORMAL HIGH (ref 0.0–100.0)

## 2022-11-10 LAB — PROTIME-INR
INR: 1.1 (ref 0.8–1.2)
Prothrombin Time: 14.7 seconds (ref 11.4–15.2)

## 2022-11-10 MED ORDER — METRONIDAZOLE 500 MG/100ML IV SOLN
500.0000 mg | Freq: Once | INTRAVENOUS | Status: AC
  Administered 2022-11-10: 500 mg via INTRAVENOUS
  Filled 2022-11-10: qty 100

## 2022-11-10 MED ORDER — LACTATED RINGERS IV BOLUS (SEPSIS)
1000.0000 mL | Freq: Once | INTRAVENOUS | Status: AC
  Administered 2022-11-10: 1000 mL via INTRAVENOUS

## 2022-11-10 MED ORDER — SUCCINYLCHOLINE CHLORIDE 200 MG/10ML IV SOSY
PREFILLED_SYRINGE | INTRAVENOUS | Status: AC
  Administered 2022-11-10: 200 mg
  Filled 2022-11-10: qty 10

## 2022-11-10 MED ORDER — MORPHINE 100MG IN NS 100ML (1MG/ML) PREMIX INFUSION
0.0000 mg/h | INTRAVENOUS | Status: DC
  Administered 2022-11-10: 5 mg/h via INTRAVENOUS
  Filled 2022-11-10: qty 100

## 2022-11-10 MED ORDER — FENTANYL CITRATE PF 50 MCG/ML IJ SOSY: 25.0000 ug | PREFILLED_SYRINGE | INTRAMUSCULAR | Status: DC | PRN

## 2022-11-10 MED ORDER — FAMOTIDINE 20 MG PO TABS: 20.0000 mg | ORAL_TABLET | Freq: Every day | ORAL | Status: DC

## 2022-11-10 MED ORDER — ACETAMINOPHEN 325 MG PO TABS: 650.0000 mg | ORAL_TABLET | Freq: Four times a day (QID) | ORAL | Status: DC | PRN

## 2022-11-10 MED ORDER — MORPHINE BOLUS VIA INFUSION
5.0000 mg | INTRAVENOUS | Status: DC | PRN
  Administered 2022-11-10 (×2): 5 mg via INTRAVENOUS

## 2022-11-10 MED ORDER — ETOMIDATE 2 MG/ML IV SOLN
INTRAVENOUS | Status: AC
  Administered 2022-11-10: 20 mg
  Filled 2022-11-10: qty 20

## 2022-11-10 MED ORDER — POLYETHYLENE GLYCOL 3350 17 G PO PACK: 17.0000 g | PACK | Freq: Every day | ORAL | Status: DC | PRN

## 2022-11-10 MED ORDER — SODIUM CHLORIDE 0.9 % IV SOLN
2.0000 g | Freq: Once | INTRAVENOUS | Status: AC
  Administered 2022-11-10: 2 g via INTRAVENOUS
  Filled 2022-11-10: qty 12.5

## 2022-11-10 MED ORDER — PROPOFOL 1000 MG/100ML IV EMUL
5.0000 ug/kg/min | INTRAVENOUS | Status: DC
  Administered 2022-11-10: 20 ug/kg/min via INTRAVENOUS

## 2022-11-10 MED ORDER — VANCOMYCIN HCL IN DEXTROSE 1-5 GM/200ML-% IV SOLN: 1000.0000 mg | INTRAVENOUS | Status: DC

## 2022-11-10 MED ORDER — PROPOFOL 1000 MG/100ML IV EMUL
INTRAVENOUS | Status: AC
  Administered 2022-11-10: 20 ug/kg/min via INTRAVENOUS
  Filled 2022-11-10: qty 100

## 2022-11-10 MED ORDER — EPINEPHRINE 1 MG/10ML IJ SOSY
1.0000 mg | PREFILLED_SYRINGE | Freq: Once | INTRAMUSCULAR | Status: AC
  Administered 2022-11-10: 1 mg via INTRAVENOUS

## 2022-11-10 MED ORDER — PROPOFOL 1000 MG/100ML IV EMUL
0.0000 ug/kg/min | INTRAVENOUS | Status: DC
  Administered 2022-11-10: 5 ug/kg/min via INTRAVENOUS
  Filled 2022-11-10: qty 100

## 2022-11-10 MED ORDER — GLYCOPYRROLATE 1 MG PO TABS: 1.0000 mg | ORAL_TABLET | ORAL | Status: DC | PRN

## 2022-11-10 MED ORDER — SODIUM CHLORIDE 0.9 % IV SOLN: 250.0000 mL | INTRAVENOUS | Status: DC

## 2022-11-10 MED ORDER — SODIUM CHLORIDE 0.9 % IV SOLN: 2.0000 g | INTRAVENOUS | Status: DC

## 2022-11-10 MED ORDER — ACETAMINOPHEN 650 MG RE SUPP: 650.0000 mg | Freq: Four times a day (QID) | RECTAL | Status: DC | PRN

## 2022-11-10 MED ORDER — POLYVINYL ALCOHOL 1.4 % OP SOLN: 1.0000 [drp] | Freq: Four times a day (QID) | OPHTHALMIC | Status: DC | PRN

## 2022-11-10 MED ORDER — HEPARIN (PORCINE) 25000 UT/250ML-% IV SOLN
700.0000 [IU]/h | INTRAVENOUS | Status: DC
  Filled 2022-11-10: qty 250

## 2022-11-10 MED ORDER — INSULIN ASPART 100 UNIT/ML IJ SOLN: 0.0000 [IU] | INTRAMUSCULAR | Status: DC

## 2022-11-10 MED ORDER — GLYCOPYRROLATE 0.2 MG/ML IJ SOLN: 0.2000 mg | INTRAMUSCULAR | Status: DC | PRN

## 2022-11-10 MED ORDER — DOCUSATE SODIUM 50 MG/5ML PO LIQD: 100.0000 mg | Freq: Two times a day (BID) | ORAL | Status: DC

## 2022-11-10 MED ORDER — SODIUM CHLORIDE 0.9 % IV SOLN: INTRAVENOUS | Status: DC

## 2022-11-10 MED ORDER — NOREPINEPHRINE 4 MG/250ML-% IV SOLN
2.0000 ug/min | INTRAVENOUS | Status: DC
  Administered 2022-11-10: 2 ug/min via INTRAVENOUS
  Filled 2022-11-10: qty 250

## 2022-11-10 MED ORDER — PANTOPRAZOLE SODIUM 40 MG IV SOLR: 40.0000 mg | Freq: Every day | INTRAVENOUS | Status: DC

## 2022-11-10 MED ORDER — HEPARIN BOLUS VIA INFUSION
2000.0000 [IU] | Freq: Once | INTRAVENOUS | Status: DC
  Filled 2022-11-10: qty 2000

## 2022-11-10 MED ORDER — ACETAMINOPHEN 325 MG PO TABS: 650.0000 mg | ORAL_TABLET | ORAL | Status: DC | PRN

## 2022-11-10 MED ORDER — POLYETHYLENE GLYCOL 3350 17 G PO PACK: 17.0000 g | PACK | Freq: Every day | ORAL | Status: DC

## 2022-11-10 MED ORDER — VANCOMYCIN HCL IN DEXTROSE 1-5 GM/200ML-% IV SOLN
1000.0000 mg | Freq: Once | INTRAVENOUS | Status: DC
  Filled 2022-11-10: qty 200

## 2022-11-10 MED ORDER — EPINEPHRINE PF 1 MG/ML IJ SOLN: 0.3000 mg | Freq: Once | INTRAMUSCULAR | Status: DC

## 2022-11-10 MED ORDER — ROCURONIUM BROMIDE 10 MG/ML (PF) SYRINGE
PREFILLED_SYRINGE | INTRAVENOUS | Status: AC
  Filled 2022-11-10: qty 10

## 2022-11-10 MED ORDER — VANCOMYCIN HCL 1250 MG/250ML IV SOLN
1250.0000 mg | Freq: Once | INTRAVENOUS | Status: AC
  Administered 2022-11-10: 1250 mg via INTRAVENOUS
  Filled 2022-11-10: qty 250

## 2022-11-10 MED ORDER — DOCUSATE SODIUM 50 MG/5ML PO LIQD: 100.0000 mg | Freq: Two times a day (BID) | ORAL | Status: DC | PRN

## 2022-11-12 LAB — HEMOGLOBIN A1C
Hgb A1c MFr Bld: 5.8 % — ABNORMAL HIGH (ref 4.8–5.6)
Mean Plasma Glucose: 120 mg/dL

## 2022-11-14 MED FILL — Fentanyl Citrate Preservative Free (PF) Inj 100 MCG/2ML: INTRAMUSCULAR | Qty: 2 | Status: AC

## 2022-11-15 LAB — CULTURE, BLOOD (ROUTINE X 2)
Culture: NO GROWTH
Culture: NO GROWTH
Special Requests: ADEQUATE
Special Requests: ADEQUATE

## 2022-11-15 NOTE — Progress Notes (Signed)
There was consult for USGPIV due to infusion of vasopressors. There is no suitable veins for USGPIV access. Informed patient's RN regarding this matter. Patient' RN wanted to put a PIV access for heparin drips. Unsuccessful for 2 times, but patient's critical condition, attempted 3 times to put in the PIV access. HS [employer]

## 2022-11-15 NOTE — Progress Notes (Signed)
Pharmacy Antibiotic Note  Robert Perry is a 89 y.o. male admitted on 10/24/2022 with  unknown source of infection .  Pharmacy has been consulted for vancomycin and cefepime dosing.  Plan: Vancomycin 1250 mg IV x 1 dose. Vancomycin 1000 mg IV every 48 hours. Cefepime 2000 mg IV every 24 hours. Monitor labs, c/s, and vanco levels as indicated.  Height: 5\' 6"  (167.6 cm) Weight: 65.8 kg (145 lb) IBW/kg (Calculated) : 63.8  Temp (24hrs), Avg:97.8 F (36.6 C), Min:97.8 F (36.6 C), Max:97.8 F (36.6 C)  Recent Labs  Lab 11/08/2022 1150  WBC 11.3*  CREATININE 1.67*  LATICACIDVEN 3.1*    Estimated Creatinine Clearance: 22.8 mL/min (A) (by C-G formula based on SCr of 1.67 mg/dL (H)).    No Known Allergies  Antimicrobials this admission: Vanco 7/27 >> Cefepime 7/27 >>  Microbiology results: 7/27 BCx: pending   Thank you for allowing pharmacy to be a part of this patient's care.  Judeth Cornfield, PharmD Clinical Pharmacist 10/23/2022 2:11 PM

## 2022-11-15 NOTE — Progress Notes (Addendum)
Patient is asystole on the monitor, no pulse, no BP.  Evaluated by myself and Ronnie Derby.  Patient expired at 2124.

## 2022-11-15 NOTE — Progress Notes (Signed)
ANTICOAGULATION CONSULT NOTE - Initial Consult  Pharmacy Consult for Heparin Indication: chest pain/ACS - arrest x 2  No Known Allergies  Patient Measurements: Height: 5\' 6"  (167.6 cm) Weight: 65.8 kg (145 lb) IBW/kg (Calculated) : 63.8 Heparin Dosing Weight: HEPARIN DW (KG): 65.8   Vital Signs: Temp: 97.4 F (36.3 C) (07/27 1459) Temp Source: Oral (07/27 1459) BP: 82/53 (07/27 1900) Pulse Rate: 109 (07/27 1801)  Labs: Recent Labs    10/31/2022 1150 10/20/2022 1256  HGB 12.3*  --   HCT 37.1*  --   PLT 279  --   APTT 27  --   LABPROT 14.7  --   INR 1.1  --   CREATININE 1.67*  --   TROPONINIHS 2,746* 2,785*    Estimated Creatinine Clearance: 22.8 mL/min (A) (by C-G formula based on SCr of 1.67 mg/dL (H)).   Medical History: Past Medical History:  Diagnosis Date   Glaucoma    Gout    Hypertension    Lung cancer (HCC)    Melanoma (HCC)    Prostate cancer (HCC)    Skin cancer     Medications:  Awaiting PTA medlist - no anticoagulants on recent fill history / records.  Assessment: 89 yo M presented to [facility] with weakness and working diagnosis of PNA.  Started antibiotics with plans to transfer to [facility].  Pt arrested x 2, once prior to transfer and once en route.  Pharmacy consulted to initiate heparin therapy.  Goal of Therapy:  Heparin level 0.3-0.7 units/ml Monitor platelets by anticoagulation protocol: Yes   Plan:  Heparin 2000 unit IV bolus x 1 Heparin infusion at 700 units/hr Heparin level in 8 hours Heparin level and CBC daily while on heparin  [employer], Pharm.D., BCPS Clinical Pharmacist  **Pharmacist phone directory can be found on [email or web address] listed under [facility].  10/29/2022 7:07 PM

## 2022-11-15 NOTE — Progress Notes (Signed)
   11/08/2022 2222  Spiritual Encounters  Type of Visit Initial  Care provided to: Friend;Patient;Pt and family  Conversation partners present during Programmer, systems  Referral source Nurse (RN/NT/LPN)  Reason for visit Patient death  OnCall Visit Yes   Chaplain responded to a call to visit Pt, as he would be removed from intubation. Chaplain arrived at found three of Robert Perry's friends, standing alongside him. Although Pt was not conscious or responding, Mirna Mires joined Robert Perry's friends in accompanying him in his last minutes. Pt's friends requested a prayer to [place]. Chaplain prayed, commending Robert Perry's soul as he departed. Pt's friends stayed until after Pt's passing. Chaplain remained with friends and provided spiritual support as well as support about what the next steps would be for Robert Perry's funeral. Lunette Stands supported friends and nurses in trying to get information from Robert Perry's nephew, to coordinate the funeral arrangements. Chaplain accompanied the friends until they left the room. Pt's friends were grateful to have some spiritual support and comfort at this time.

## 2022-11-15 NOTE — H&P (Signed)
NAME:  Robert Perry, MRN:  [id number], DOB:  [birth year], LOS: 0 ADMISSION DATE:  10/23/2022, CONSULTATION DATE:  11/07/2022 REFERRING MD:  Otis Dials - [facility] EM, CHIEF COMPLAINT:  weakness, SOB    History of Present Illness:  89 yo M PMH lung cancer HTN HLD gout and open angle glaucoma presented to [facility] ED 7/27 with CC weakness, associated SOB abd distention. In ED pt was started on tx for possible sepsis : 1L IVF, vanc, cefepime. He was noted to be hypoxic requiring HFNC.  A CT c/a/p was obtained which shows R suprahilar soft tissue density (at site of previous cancer tx), BLL consolidation, subcarinal lymphadenopathy, hepatic hypodensities, and cardiomegaly + pulm vasc congestion + bilat pleural effusions    Trop-I is elevated 2700  BNP elevated 2400  LA 3.1 --> 4.3   Just prior to transport he developed brief bradycardic arrest requiring 3 minutes of CPR and epi x 1 before ROSC.  ED attending asked him and he requested full CODE STATUS, he was intubated prior to transport No family member except neighbor available  Pertinent  Medical History  Lung cancer  HTN HLD   Significant Hospital Events: Including procedures, antibiotic start and stop dates in addition to other pertinent events   7/27 [facility] ED with SOB weakness. Started on abx for possible sepsis, labs and imaging suggestive of HF. CT also concerning for possible malignancy with enlarged R hilar density, enlarged lymph nodes and hypodensities in liver   Interim History / Subjective:  Intubated and sedated  Objective   Blood pressure 110/63, pulse 85, temperature (!) 97.4 F (36.3 C), temperature source Oral, resp. rate 20, height 5\' 6"  (1.676 m), weight 65.8 kg, SpO2 (!) 85%.    FiO2 (%):  [70 %] 70 %  No intake or output data in the 24 hours ending 10/25/2022 1610 Filed Weights   11/11/2022 1109  Weight: 65.8 kg    Examination: General: Elderly man, thin, poorly nourished, intubated and sedated HENT: Dilated pupils, bilateral  surgical, no pallor Lungs: Bilateral ventilated breath sounds, crackles right base, no accessory muscle use Cardiovascular: S1-S2 tacky, regular Abdomen: Soft, distended, nontender, no guarding Extremities: Mottling below umbilicus, pulses heard by Doppler Neuro: Sedated on low-dose propofol, RASS -2 GU: Clear urine  Chest x-ray shows ET tube in position bilateral lower lobe airspace disease Lactate increased to 5.6  Resolved Hospital Problem list     Assessment & Plan:   Acute hypoxic respiratory failure  Bilateral lower lobe consolidation/community-acquired pneumonia versus aspiration  -Lung protective ventilation -Using propofol and intermittent fentanyl for sedation with goal RASS 0 to -1 -Obtain respiratory culture, empiric cefepime/vancomycin meantime  Sepsis versus cardiogenic shock -Currently acceptable blood pressure -Levophed PIV as needed   Acute congestive heart failure Non-STEMI  -IV heparin per pharmacy -Cardiology input, track troponins -Await echo  AKI -follow BMET and urine output  Hypodense liver lesions -noted on prior CT but could be concerning for malignancy. Right infrahilar infiltrate and nodule appears unchanged and likely related to prior treated lung cancer  Goals of care -no family, neighbor does not have POA.  Guarded prognosis in this 89 year old, would not subject him to more CPR-recent Bill to continue current interventions for a period of time 24 to 48 hours to see if his prognosis improves and current situation is reversible or not  Best Practice (right click and "Reselect all SmartList Selections" daily)   Diet/type: NPO DVT prophylaxis: systemic heparin GI prophylaxis: PPI Lines: N/A Foley:  N/A Code  Status:  full code Last date of multidisciplinary goals of care discussion [NA]  Labs   CBC: Recent Labs  Lab 10/21/2022 1150  WBC 11.3*  NEUTROABS 9.9*  HGB 12.3*  HCT 37.1*  MCV 98.1  PLT 279    Basic Metabolic  Panel: Recent Labs  Lab 11/02/2022 1150  NA 135  K 5.1  CL 103  CO2 20*  GLUCOSE 200*  BUN 43*  CREATININE 1.67*  CALCIUM 9.0   GFR: Estimated Creatinine Clearance: 22.8 mL/min (A) (by C-G formula based on SCr of 1.67 mg/dL (H)). Recent Labs  Lab 11/06/2022 1150 10/21/2022 1419  WBC 11.3*  --   LATICACIDVEN 3.1* 4.3*    Liver Function Tests: Recent Labs  Lab 11/12/2022 1150  AST 42*  ALT 25  ALKPHOS 56  BILITOT 1.3*  PROT 6.8  ALBUMIN 3.5   Recent Labs  Lab 10/20/2022 1150  LIPASE 35   No results for input(s): "AMMONIA" in the last 168 hours.  ABG    Component Value Date/Time   HCO3 19.6 (L) 11/07/2022 1504   ACIDBASEDEF 6.2 (H) 11/07/2022 1504   O2SAT 25.9 11/08/2022 1504     Coagulation Profile: Recent Labs  Lab 11/12/2022 1150  INR 1.1    Cardiac Enzymes: No results for input(s): "CKTOTAL", "CKMB", "CKMBINDEX", "TROPONINI" in the last 168 hours.  HbA1C: No results found for: "HGBA1C"  CBG: No results for input(s): "GLUCAP" in the last 168 hours.  Review of Systems:   Unable to obtain  Past Medical History:  He,  has a past medical history of Glaucoma, Gout, Hypertension, Lung cancer (HCC), Melanoma (HCC), Prostate cancer (HCC), and Skin cancer.   Surgical History:   Past Surgical History:  Procedure Laterality Date   CATARACT EXTRACTION       Social History:   reports that he quit smoking about 55 years ago. His smoking use included cigarettes. He has never used smokeless tobacco. He reports that he does not drink alcohol and does not use drugs.   Family History:  His family history includes Heart disease in his father. There is no history of Cancer.   Allergies No Known Allergies   Home Medications  Prior to Admission medications   Medication Sig Start Date End Date Taking? Authorizing Provider  allopurinol (ZYLOPRIM) 300 MG tablet Take 300 mg by mouth daily.   Yes [provider]  amLODipine (NORVASC) 5 MG tablet Take by  mouth.   Yes [provider]  aspirin EC 81 MG tablet Take 81 mg by mouth daily.   Yes [provider]  benazepril (LOTENSIN) 20 MG tablet Take 20 mg by mouth daily.   Yes [provider]  atenolol (TENORMIN) 25 MG tablet Take 12.5 mg by mouth daily.    [provider]  Latanoprost 0.005 % EMUL PLACE 1 DROP INTO EACH EYE NIGHTLY 09/10/17   [provider]  timolol (TIMOPTIC) 0.5 % ophthalmic solution Place 1 drop into both eyes daily. 02/09/16   [provider]  travoprost, benzalkonium, (TRAVATAN) 0.004 % ophthalmic solution 1 drop at bedtime.    [provider]     Critical care time: 75m    Cyril Mourning MD. [facility] [place] [facility] Pager : [phone]  If no response to pager , please call [phone] until 7 pm After 7:00 pm call Elink  [phone]   11/14/2022

## 2022-11-15 NOTE — ED Notes (Signed)
[facility] was in room to transfer pt to [facility], [facility] nurse called a code blue at 1645, compressions started, got a pulse back at 1646, dose of epi at 1647

## 2022-11-15 NOTE — Progress Notes (Signed)
Critical care progress note  Called to bedside to assess this 89 year old man who is received in transfer from [facility].  He has no direct family relations and only a neighbor is listed as a contact.Marland Kitchen  He was transferred with a working diagnosis of community-acquired pneumonia however he suffered 2 successive cardiac arrest 1 and any pending 1 on the way.  At present he is in the ICU intubated with marginal saturations.  He is cold and mottled with increased work of breathing.  -At this point this is an elderly man who has already suffered a cardiac arrest and is in persistent shock.  His mortality is essentially 100% and he would not be served well by further aggressive care if he were to did deteriorate further.  I think it is reasonable at this point to make him a DNR on the basis that CPR would be medically futile.  Lynnell Catalan, MD [facility] ICU Physician [facility]  Pager: [phone] Or Epic Secure Chat After hours: [phone].  10/24/2022, 6:54 PM

## 2022-11-15 NOTE — ED Triage Notes (Signed)
Pt c/o feeling weak and having epigastric pain that started last night; pt c/o sob with exertion

## 2022-11-15 NOTE — Sepsis Progress Note (Addendum)
[facility] following code sepsis  1345 messaged provider and beside RN, pt meet criteria ( based of MAP) for fluid bolus of based on weight but only 1L given so far, pt with elevated BNP, fluid overloaded and hypoxic will hold off on additional fluids.   [id number] messaged provider asking for 3rd lactic, second resulted at 4.3 which was increased from first result.

## 2022-11-15 NOTE — Progress Notes (Signed)
Approximately 1415 attempted to place patient on BiPAP he held the mask to his face and stated" I can't, I'm claustrophobic." He did try a second time and then refused. RN and doctor notified. Dr. Charm Barges gave order for [facility].

## 2022-11-15 NOTE — Death Summary Note (Signed)
DEATH SUMMARY   Patient Details  Name: Robert Perry MRN: [id number] DOB: Mar 13, 1924  Admission/Discharge Information   Admit Date:  11/16/2022  Date of Death: Date of Death: 11/16/22  Time of Death: Time of Death: 11/24/22  Length of Stay: 0  Referring Physician: Rebekah Chesterfield, NP   Reason(s) for Hospitalization  SOB  Diagnoses  Preliminary cause of death: acute systolic heart failure Secondary Diagnoses (including complications and co-morbidities):  Probable recurrent lung cancer Acute hypoxemic respiratory failure In-hospital cardiac arrest Septic vs. Cardiogenic shock: clinically undetermined.   Brief Hospital Course (including significant findings, care, treatment, and services provided and events leading to death)  89 yo M PMH lung cancer HTN HLD gout and open angle glaucoma presented to [facility] ED Nov 16, 2022 with CC weakness, associated SOB abd distention. In ED pt was started on tx for possible sepsis : 1L IVF, vanc, cefepime. He was noted to be hypoxic requiring HFNC.  A CT c/a/p was obtained which shows R suprahilar soft tissue density (at site of previous cancer tx), BLL consolidation, subcarinal lymphadenopathy, hepatic hypodensities, and cardiomegaly + pulm vasc congestion + bilat pleural effusions    Trop-I is elevated 2700  BNP elevated 2400  LA 3.1 --> 4.3    Just prior to transport he developed brief bradycardic arrest requiring 3 minutes of CPR and epi x 1 before ROSC.  ED attending asked him and he requested full CODE STATUS, he was intubated prior to transport No family member except neighbor available  On arrival to [facility] from [facility], patient became more mottled with escalating pressor needs, agonal breathing pattern on ventilator. After discussion with neighbor all in agreement to allow natural passing.    Pertinent Labs and Studies  Significant Diagnostic Studies DG Chest Port 1 View  Result Date: 11/16/2022 CLINICAL DATA:  Intubated EXAM: PORTABLE CHEST 1 VIEW  COMPARISON:  11/16/22 FINDINGS: Single frontal view of the chest demonstrates endotracheal tube overlying tracheal air column tip midway between thoracic inlet and carina. Enteric catheter passes below diaphragm tip excluded by collimation. Defibrillator pads overlie the left chest. Cardiac silhouette is stable. Stable right suprahilar density consistent with masslike consolidation on recent CT. Increased vascular congestion and interstitial prominence consistent with developing edema. Small bilateral pleural effusions. No pneumothorax. No acute bony abnormalities. IMPRESSION: 1. No complication after intubation. 2. Worsening volume status. 3. Masslike consolidation right suprahilar region unchanged. Please see recent CT evaluation. Electronically Signed   By: Sharlet Salina M.D.   On: November 16, 2022 17:18   CT CHEST ABDOMEN PELVIS WO CONTRAST  Result Date: 11/16/2022 CLINICAL DATA:  Weakness, epigastric pain, sepsis, dyspnea on exertion EXAM: CT CHEST, ABDOMEN AND PELVIS WITHOUT CONTRAST TECHNIQUE: Multidetector CT imaging of the chest, abdomen and pelvis was performed following the standard protocol without IV contrast. RADIATION DOSE REDUCTION: This exam was performed according to the departmental dose-optimization program which includes automated exposure control, adjustment of the mA and/or kV according to patient size and/or use of iterative reconstruction technique. COMPARISON:  11-16-22, 12/05/2017, 06/22/2016 FINDINGS: CT CHEST FINDINGS Cardiovascular: The heart is enlarged without pericardial effusion. Normal caliber of the thoracic aorta. Atherosclerosis of the aorta and coronary vasculature. Valuation of the vascular lumen is limited without IV contrast. Mediastinum/Nodes: Subcarinal adenopathy measuring up to 15 mm in short axis, reference image 29/2. No other pathologic adenopathy. Thyroid, trachea, and esophagus are unremarkable. Lungs/Pleura: There are small bilateral pleural effusions, right  greater than left. Dense consolidation and volume loss at the lung bases, favor atelectasis over infection  or aspiration. Dilated central pulmonary vasculature and mild interlobular septal thickening consistent with fluid overload and interstitial edema. Interval increase in size of the right suprahilar soft tissue density at the site of prior treated lung cancer, now measuring 3.3 x 2.9 cm reference image 61/5. This results in obstruction of the segmental right upper lobe bronchi, and recurrent disease cannot be excluded. If the patient would be a therapy candidate should neoplasm be detected, follow-up PET scan could be considered. Part solid left upper lobe pulmonary nodule has increased in size, measuring 10 x 7 mm reference image 25/5, previously measuring 7 x 5 mm. Musculoskeletal: No acute or destructive bony abnormalities. Reconstructed images demonstrate no additional findings. CT ABDOMEN PELVIS FINDINGS Hepatobiliary: Multiple calcified gallstones without cholecystitis. Indeterminate hypodensities within the left lobe liver, measuring 1 cm image 58/2 and 1.1 cm image 60/2. Indeterminate hypodensity right lobe liver measuring 1.4 cm image 75/2. These have increased slightly in size since previous exams. No biliary duct dilation. Pancreas: Unremarkable unenhanced appearance. Spleen: Unremarkable unenhanced appearance. Adrenals/Urinary Tract: The adrenals are unremarkable. Bilateral renal cortical thinning. Nonobstructing 7 mm left renal calculus. No obstructive uropathy within either kidney. Bladder is decompressed, limiting its evaluation. Stomach/Bowel: No bowel obstruction or ileus. Colonic diverticulosis without diverticulitis. Normal appendix right lower quadrant. No bowel wall thickening or inflammatory change. Vascular/Lymphatic: Stable 3.3 cm infrarenal abdominal aortic aneurysm. No pathologic adenopathy. Reproductive: Prostate is unremarkable. Other: No free fluid or free intraperitoneal gas. There  is a large left inguinal hernia containing a a segment of the sigmoid colon. No evidence of bowel incarceration or obstruction. Bilateral hydroceles are partially visualized. Musculoskeletal: No acute or destructive bony abnormalities. Reconstructed images demonstrate no additional findings. IMPRESSION: 1. Enlarging soft tissue density in the right suprahilar region, with occlusion of segmental right upper lobe bronchi. This is at the site of prior treated lung cancer, and disease recurrence/progression cannot be excluded. Further evaluation with PET scan could be performed if the patient would be a therapy candidate should neoplasm be detected. 2. Cardiomegaly, pulmonary vascular congestion, interlobular septal thickening, and bilateral pleural effusions. Constellation of findings is most consistent with congestive heart failure and fluid overload. 3. Dense dependent bilateral lower lobe consolidation, favor atelectasis over airspace disease or aspiration. 4. Subcarinal lymphadenopathy, metastatic disease not excluded. 5. Indeterminate hypodensities within the liver, slightly increased since prior study. Again, metastatic disease cannot be excluded. 6. Cholelithiasis without cholecystitis. 7. Left inguinal hernia containing a portion of the sigmoid colon. No bowel obstruction or incarceration. 8. Colonic diverticulosis without diverticulitis. 9. Cholelithiasis without cholecystitis. 10. Abdominal aortic aneurysm measuring 3.3 cm infrarenal. Recommend follow-up every 3 years. Reference: J Am Coll Radiol 2013;10:789-794. 11.  Aortic Atherosclerosis (ICD10-I70.0). 12. Incidental bilateral hydroceles, unchanged. Electronically Signed   By: Sharlet Salina M.D.   On: 11/09/2022 14:57   DG Chest Portable 1 View  Result Date: 10/25/2022 CLINICAL DATA:  Provided history: Chest pain. Additional history provided: Weakness. Epigastric pain. Shortness of breath with exertion. Additional history obtained from electronic  MEDICAL RECORD NUMBERHistory of lung cancer. EXAM: PORTABLE CHEST 1 VIEW COMPARISON:  Chest CT 12/05/2017. FINDINGS: Cardiomegaly. Aortic atherosclerosis. Emphysema, better delineated on the prior chest CT of 12/05/2017. Region of post-radiation fibrosis/scarring within the right upper lobe. Interstitial and linear opacities within the mid and lower right lung. No appreciable airspace consolidation on the left. Blunting of the bilateral costophrenic angles which may reflect trace pleural effusions or chronic pleural thickening. No evidence of pneumothorax. No acute osseous abnormality identified. Degenerative changes  of the spine. IMPRESSION: 1. Region of post-radiation fibrosis/scarring within the right upper lobe. Please note, there is limited evaluation for residual/recurrent pulmonary malignancy by chest radiography. 2. Interstitial and linear opacities within the mid and lower right lung, which may reflect atelectasis, asymmetric edema and/or infection. 3. Bilateral pleural thickening and/or trace pleural effusions. 4. Cardiomegaly. 5. Aortic Atherosclerosis (ICD10-I70.0). Electronically Signed   By: Jackey Loge D.O.   On: 11/03/2022 13:00    Microbiology Recent Results (from the past 240 hour(s))  Resp panel by RT-PCR (RSV, Flu A&B, Covid) Anterior Nasal Swab     Status: None   Collection Time: 11/09/2022 11:40 AM   Specimen: Anterior Nasal Swab  Result Value Ref Range Status   SARS Coronavirus 2 by RT PCR NEGATIVE NEGATIVE Final    Comment: (NOTE) SARS-CoV-2 target nucleic acids are NOT DETECTED.  The SARS-CoV-2 RNA is generally detectable in upper respiratory specimens during the acute phase of infection. The lowest concentration of SARS-CoV-2 viral copies this assay can detect is 138 copies/mL. A negative result does not preclude SARS-Cov-2 infection and should not be used as the sole basis for treatment or other patient management decisions. A negative result may occur with  improper specimen  collection/handling, submission of specimen other than nasopharyngeal swab, presence of viral mutation(s) within the areas targeted by this assay, and inadequate number of viral copies(<138 copies/mL). A negative result must be combined with clinical observations, patient history, and epidemiological information. The expected result is Negative.  Fact Sheet for Patients:  BloggerCourse.com  Fact Sheet for Healthcare Providers:  SeriousBroker.it  This test is no t yet approved or cleared by the [place] FDA and  has been authorized for detection and/or diagnosis of SARS-CoV-2 by FDA under an Emergency Use Authorization (EUA). This EUA will remain  in effect (meaning this test can be used) for the duration of the COVID-19 declaration under Section 564(b)(1) of the Act, 21 U.S.C.section 360bbb-3(b)(1), unless the authorization is terminated  or revoked sooner.       Influenza A by PCR NEGATIVE NEGATIVE Final   Influenza B by PCR NEGATIVE NEGATIVE Final    Comment: (NOTE) The Xpert Xpress SARS-CoV-2/FLU/RSV plus assay is intended as an aid in the diagnosis of influenza from Nasopharyngeal swab specimens and should not be used as a sole basis for treatment. Nasal washings and aspirates are unacceptable for Xpert Xpress SARS-CoV-2/FLU/RSV testing.  Fact Sheet for Patients: BloggerCourse.com  Fact Sheet for Healthcare Providers: SeriousBroker.it  This test is not yet approved or cleared by the [place] FDA and has been authorized for detection and/or diagnosis of SARS-CoV-2 by FDA under an Emergency Use Authorization (EUA). This EUA will remain in effect (meaning this test can be used) for the duration of the COVID-19 declaration under Section 564(b)(1) of the Act, 21 U.S.C. section 360bbb-3(b)(1), unless the authorization is terminated or revoked.     Resp Syncytial  Virus by PCR NEGATIVE NEGATIVE Final    Comment: (NOTE) Fact Sheet for Patients: BloggerCourse.com  Fact Sheet for Healthcare Providers: SeriousBroker.it  This test is not yet approved or cleared by the [place] FDA and has been authorized for detection and/or diagnosis of SARS-CoV-2 by FDA under an Emergency Use Authorization (EUA). This EUA will remain in effect (meaning this test can be used) for the duration of the COVID-19 declaration under Section 564(b)(1) of the Act, 21 U.S.C. section 360bbb-3(b)(1), unless the authorization is terminated or revoked.  Performed at [facility], [address]  [place], [place] [address]   Blood Culture (routine x 2)     Status: None (Preliminary result)   Collection Time: 10/19/2022 12:00 PM   Specimen: Left Antecubital; Blood  Result Value Ref Range Status   Specimen Description   Final    LEFT ANTECUBITAL BOTTLES DRAWN AEROBIC AND ANAEROBIC   Special Requests   Final    Blood Culture adequate volume Performed at [facility], [address] [place], [place] [address]    Culture PENDING  Incomplete   Report Status PENDING  Incomplete  Blood Culture (routine x 2)     Status: None (Preliminary result)   Collection Time: 10/15/2022 12:08 PM   Specimen: BLOOD RIGHT WRIST  Result Value Ref Range Status   Specimen Description   Final    BLOOD RIGHT WRIST BOTTLES DRAWN AEROBIC AND ANAEROBIC   Special Requests   Final    Blood Culture adequate volume Performed at [facility], [address], [place], [place] [address]    Culture PENDING  Incomplete   Report Status PENDING  Incomplete  MRSA Next Gen by PCR, Nasal     Status: None   Collection Time: 11/06/2022  6:29 PM   Specimen: Nasal Mucosa; Nasal Swab  Result Value Ref Range Status   MRSA by PCR Next Gen NOT DETECTED NOT DETECTED Final    Comment: (NOTE) The GeneXpert MRSA Assay (FDA approved for NASAL specimens only), is one component  of a comprehensive MRSA colonization surveillance program. It is not intended to diagnose MRSA infection nor to guide or monitor treatment for MRSA infections. Test performance is not FDA approved in patients less than 33 years old. Performed at [facility], [address] [place], [place] [address]     Lab Basic Metabolic Panel: Recent Labs  Lab 11/06/2022 1150 11/11/2022 1850  NA 135 135  K 5.1 6.2*  CL 103 103  CO2 20* 11*  GLUCOSE 200* 167*  BUN 43* 45*  CREATININE 1.67* 2.19*  CALCIUM 9.0 8.7*   Liver Function Tests: Recent Labs  Lab 10/26/2022 1150  AST 42*  ALT 25  ALKPHOS 56  BILITOT 1.3*  PROT 6.8  ALBUMIN 3.5   Recent Labs  Lab 10/31/2022 1150  LIPASE 35   No results for input(s): "AMMONIA" in the last 168 hours. CBC: Recent Labs  Lab 10/16/2022 1150 10/17/2022 1850  WBC 11.3* 14.8*  NEUTROABS 9.9*  --   HGB 12.3* 12.2*  HCT 37.1* 39.3  MCV 98.1 103.7*  PLT 279 241   Cardiac Enzymes: No results for input(s): "CKTOTAL", "CKMB", "CKMBINDEX", "TROPONINI" in the last 168 hours. Sepsis Labs: Recent Labs  Lab 11/12/2022 1150 10/21/2022 1419 10/15/2022 1642 10/31/2022 1850  WBC 11.3*  --   --  14.8*  LATICACIDVEN 3.1* 4.3* 5.6* >9.0*      Lorin Glass 10/15/2022, 10:16 PM

## 2022-11-15 NOTE — ED Provider Notes (Signed)
I was called to the bedside due to a cardiac arrest.  Upon arrival of the transport team the patient started to have more hypoxia, he then had a bradycardic arrest requiring approximately 1 minute of CPR.  The patient had a single dose of epinephrine under my guidance with improvement in his heart rate, with bag-valve-mask ventilation and full oxygen he was able to improve his oxygenation to the point where he was able to start breathing and had a spontaneous pulse.  He was still hypoxic and required intubation at his request he wanted to be a full code.  Procedure Name: Intubation Date/Time: 10/16/2022 4:58 PM  Performed by: Eber Hong, MDPre-anesthesia Checklist: Patient identified, Emergency Drugs available, Suction available and Patient being monitored Oxygen Delivery Method: Ambu bag Preoxygenation: Pre-oxygenation with 100% oxygen Induction Type: Rapid sequence Ventilation: Mask ventilation with difficulty and Two handed mask ventilation required Laryngoscope Size: Mac and 4 Tube size: 7.5 mm Number of attempts: 1 Airway Equipment and Method: Stylet Placement Confirmation: ETT inserted through vocal cords under direct vision, Positive ETCO2, Breath sounds checked- equal and bilateral and CO2 detector Secured at: 23 cm Tube secured with: ETT holder Dental Injury: Teeth and Oropharynx as per pre-operative assessment  Difficulty Due To: Difficulty was unanticipated Comments:      CPR  Date/Time: 11/09/2022 4:59 PM  Performed by: Eber Hong, MD Authorized by: Eber Hong, MD  CPR Procedure Details:      Amount of time prior to administration of ACLS/BLS (minutes):  1   ACLS/BLS initiated by EMS: No     CPR/ACLS performed in the ED: Yes     Duration of CPR (minutes):  2   Outcome: ROSC obtained    CPR performed via ACLS guidelines under my direct supervision.  See RN documentation for details including defibrillator use, medications, doses and timing. Comments:         Fall started, patient to be transported to ICU at [facility], suspect primary respiratory hypoxic arrest   Eber Hong, MD 10/22/2022 1700

## 2022-11-15 NOTE — Consult Note (Signed)
Cardiology Consultation   Patient ID: Robert Perry MRN: [id number]; DOB: 1924/02/22  Admit date: 10/23/2022 Date of Consult: 10/21/2022  PCP:  Rebekah Chesterfield, NP   [facility] Providers Cardiologist:  None        Patient Profile:   Robert Perry is a 89 y.o. male with a hx of  lung cancer HTN HLD gout and open angle glaucoma  who is being seen 11/01/2022 for the evaluation of elevated troponin at the request of Dr Vassie Loll.  History of Present Illness:   Robert Perry is a 89 y.o. male with a hx of  lung cancer HTN HLD gout and open angle glaucoma  who is being seen 10/22/2022 for the evaluation of elevated troponin   Pt is intubated and sedated- Per report- pt came in with weakness, sob, abd distention and possible sepsis-  he was on high flow oxygen in the ER-and had a bradycardic arrest (per report)- s/p CPR for 3 mins, epi x1 and subsequently intubated. Cards called for elevated trop 2745  History cannot be obtained as pt intubated and sedated When I went into the room, neighbors were present,  Nurse reports that 3 physicians and neighbors decided to go with comfort care route.  This patient was not examined and agree with the decision given advanced age and significant septic shock.  Patient will be transition to comfort care soon per nurse  Past Medical History:  Diagnosis Date   Glaucoma    Gout    Hypertension    Lung cancer (HCC)    Melanoma (HCC)    Prostate cancer (HCC)    Skin cancer     Past Surgical History:  Procedure Laterality Date   CATARACT EXTRACTION       Home Medications:  Prior to Admission medications   Medication Sig Start Date End Date Taking? Authorizing Provider  allopurinol (ZYLOPRIM) 300 MG tablet Take 300 mg by mouth daily.   Yes [provider]  amLODipine (NORVASC) 5 MG tablet Take by mouth.   Yes [provider]  aspirin EC 81 MG tablet Take 81 mg by mouth daily.   Yes [provider]  benazepril  (LOTENSIN) 20 MG tablet Take 20 mg by mouth daily.   Yes [provider]  atenolol (TENORMIN) 25 MG tablet Take 12.5 mg by mouth daily.    [provider]  Latanoprost 0.005 % EMUL PLACE 1 DROP INTO EACH EYE NIGHTLY 09/10/17   [provider]  timolol (TIMOPTIC) 0.5 % ophthalmic solution Place 1 drop into both eyes daily. 02/09/16   [provider]  travoprost, benzalkonium, (TRAVATAN) 0.004 % ophthalmic solution 1 drop at bedtime.    [provider]    Inpatient Medications: Scheduled Meds:  docusate  100 mg Per Tube BID   EPINEPHrine  0.3 mg Intravenous Once   famotidine  20 mg Per Tube Daily   heparin  2,000 Units Intravenous Once   insulin aspart  0-9 Units Subcutaneous Q4H   pantoprazole (PROTONIX) IV  40 mg Intravenous QHS   polyethylene glycol  17 g Per Tube Daily   Continuous Infusions:  sodium chloride     [START ON 11/11/2022] ceFEPime (MAXIPIME) IV     heparin     norepinephrine (LEVOPHED) Adult infusion 4 mcg/min (10/16/2022 1900)   propofol (DIPRIVAN) infusion     [START ON 11/12/2022] vancomycin     PRN Meds: acetaminophen, docusate, fentaNYL (SUBLIMAZE) injection, fentaNYL (SUBLIMAZE) injection, polyethylene glycol  Allergies:  No Known Allergies  Social History:   Social History   Socioeconomic History   Marital status: Married    Spouse name: Not on file   Number of children: Not on file   Years of education: Not on file   Highest education level: Not on file  Occupational History   Not on file  Tobacco Use   Smoking status: Former    Current packs/day: 0.00    Types: Cigarettes    Quit date: 39    Years since quitting: 55.6   Smokeless tobacco: Never  Vaping Use   Vaping status: Never Used  Substance and Sexual Activity   Alcohol use: No   Drug use: No   Sexual activity: Never  Other Topics Concern   Not on file  Social History Narrative   Not on file   Social Determinants of Health   Financial  Resource Strain: Not on file  Food Insecurity: Not on file  Transportation Needs: Not on file  Physical Activity: Not on file  Stress: Not on file  Social Connections: Not on file  Intimate Partner Violence: Not on file    Family History:    Family History  Problem Relation Age of Onset   Heart disease Father    Cancer Neg Hx      ROS:  Please see the history of present illness.   All other ROS reviewed and negative.     Physical Exam/Data:   Vitals:   11/03/2022 1830 10/25/2022 1851 11/13/2022 1853 10/19/2022 1900  BP: (!) 85/52 (!) 79/62 (!) 82/44 (!) 82/53  Pulse:      Resp: (!) 24 (!) 27 (!) 26 (!) 28  Temp:      TempSrc:      SpO2:      Weight:      Height:        Intake/Output Summary (Last 24 hours) at 10/18/2022 1912 Last data filed at 10/23/2022 1900 Gross per 24 hour  Intake 221.45 ml  Output 145 ml  Net 76.45 ml      10/22/2022   11:09 AM 12/06/2017    9:48 AM 04/19/2017    1:23 PM  Last 3 Weights  Weight (lbs) 145 lb 145 lb 12.8 oz 150 lb 3.2 oz  Weight (kg) 65.772 kg 66.134 kg 68.13 kg     Body mass index is 23.4 kg/m.  Patient not examined, decision was made to go into comfort care for physicians and neighbors  EKG:  The EKG was personally reviewed and demonstrates:  ectopic atach, LBBB- unclear chronicity  Relevant CV Studies: Preserved EF in 2018  Laboratory Data:  High Sensitivity Troponin:   Recent Labs  Lab 11/04/2022 1150 11/11/2022 1256  TROPONINIHS 2,746* 2,785*     Chemistry Recent Labs  Lab 10/21/2022 1150  NA 135  K 5.1  CL 103  CO2 20*  GLUCOSE 200*  BUN 43*  CREATININE 1.67*  CALCIUM 9.0  GFRNONAA 37*  ANIONGAP 12    Recent Labs  Lab 10/30/2022 1150  PROT 6.8  ALBUMIN 3.5  AST 42*  ALT 25  ALKPHOS 56  BILITOT 1.3*   Lipids No results for input(s): "CHOL", "TRIG", "HDL", "LABVLDL", "LDLCALC", "CHOLHDL" in the last 168 hours.  Hematology Recent Labs  Lab 10/27/2022 1150 11/07/2022 1850  WBC 11.3* 14.8*  RBC 3.78*  3.79*  HGB 12.3* 12.2*  HCT 37.1* 39.3  MCV 98.1 103.7*  MCH 32.5 32.2  MCHC 33.2 31.0  RDW 13.6 13.7  PLT 279 241   Thyroid No results for input(s): "TSH", "FREET4" in the last 168 hours.  BNP Recent Labs  Lab 10/26/2022 1150  BNP 2,402.0*    DDimer No results for input(s): "DDIMER" in the last 168 hours.   Radiology/Studies:  DG Chest Port 1 View  Result Date: 10/31/2022 CLINICAL DATA:  Intubated EXAM: PORTABLE CHEST 1 VIEW COMPARISON:  10/25/2022 FINDINGS: Single frontal view of the chest demonstrates endotracheal tube overlying tracheal air column tip midway between thoracic inlet and carina. Enteric catheter passes below diaphragm tip excluded by collimation. Defibrillator pads overlie the left chest. Cardiac silhouette is stable. Stable right suprahilar density consistent with masslike consolidation on recent CT. Increased vascular congestion and interstitial prominence consistent with developing edema. Small bilateral pleural effusions. No pneumothorax. No acute bony abnormalities. IMPRESSION: 1. No complication after intubation. 2. Worsening volume status. 3. Masslike consolidation right suprahilar region unchanged. Please see recent CT evaluation. Electronically Signed   By: Sharlet Salina M.D.   On: 10/18/2022 17:18   CT CHEST ABDOMEN PELVIS WO CONTRAST  Result Date: 10/23/2022 CLINICAL DATA:  Weakness, epigastric pain, sepsis, dyspnea on exertion EXAM: CT CHEST, ABDOMEN AND PELVIS WITHOUT CONTRAST TECHNIQUE: Multidetector CT imaging of the chest, abdomen and pelvis was performed following the standard protocol without IV contrast. RADIATION DOSE REDUCTION: This exam was performed according to the departmental dose-optimization program which includes automated exposure control, adjustment of the mA and/or kV according to patient size and/or use of iterative reconstruction technique. COMPARISON:  11/12/2022, 12/05/2017, 06/22/2016 FINDINGS: CT CHEST FINDINGS Cardiovascular: The heart  is enlarged without pericardial effusion. Normal caliber of the thoracic aorta. Atherosclerosis of the aorta and coronary vasculature. Valuation of the vascular lumen is limited without IV contrast. Mediastinum/Nodes: Subcarinal adenopathy measuring up to 15 mm in short axis, reference image 29/2. No other pathologic adenopathy. Thyroid, trachea, and esophagus are unremarkable. Lungs/Pleura: There are small bilateral pleural effusions, right greater than left. Dense consolidation and volume loss at the lung bases, favor atelectasis over infection or aspiration. Dilated central pulmonary vasculature and mild interlobular septal thickening consistent with fluid overload and interstitial edema. Interval increase in size of the right suprahilar soft tissue density at the site of prior treated lung cancer, now measuring 3.3 x 2.9 cm reference image 61/5. This results in obstruction of the segmental right upper lobe bronchi, and recurrent disease cannot be excluded. If the patient would be a therapy candidate should neoplasm be detected, follow-up PET scan could be considered. Part solid left upper lobe pulmonary nodule has increased in size, measuring 10 x 7 mm reference image 25/5, previously measuring 7 x 5 mm. Musculoskeletal: No acute or destructive bony abnormalities. Reconstructed images demonstrate no additional findings. CT ABDOMEN PELVIS FINDINGS Hepatobiliary: Multiple calcified gallstones without cholecystitis. Indeterminate hypodensities within the left lobe liver, measuring 1 cm image 58/2 and 1.1 cm image 60/2. Indeterminate hypodensity right lobe liver measuring 1.4 cm image 75/2. These have increased slightly in size since previous exams. No biliary duct dilation. Pancreas: Unremarkable unenhanced appearance. Spleen: Unremarkable unenhanced appearance. Adrenals/Urinary Tract: The adrenals are unremarkable. Bilateral renal cortical thinning. Nonobstructing 7 mm left renal calculus. No obstructive uropathy  within either kidney. Bladder is decompressed, limiting its evaluation. Stomach/Bowel: No bowel obstruction or ileus. Colonic diverticulosis without diverticulitis. Normal appendix right lower quadrant. No bowel wall thickening or inflammatory change. Vascular/Lymphatic: Stable 3.3 cm infrarenal abdominal aortic aneurysm. No pathologic adenopathy. Reproductive: Prostate is unremarkable. Other: No free fluid or free intraperitoneal gas. There is a  large left inguinal hernia containing a a segment of the sigmoid colon. No evidence of bowel incarceration or obstruction. Bilateral hydroceles are partially visualized. Musculoskeletal: No acute or destructive bony abnormalities. Reconstructed images demonstrate no additional findings. IMPRESSION: 1. Enlarging soft tissue density in the right suprahilar region, with occlusion of segmental right upper lobe bronchi. This is at the site of prior treated lung cancer, and disease recurrence/progression cannot be excluded. Further evaluation with PET scan could be performed if the patient would be a therapy candidate should neoplasm be detected. 2. Cardiomegaly, pulmonary vascular congestion, interlobular septal thickening, and bilateral pleural effusions. Constellation of findings is most consistent with congestive heart failure and fluid overload. 3. Dense dependent bilateral lower lobe consolidation, favor atelectasis over airspace disease or aspiration. 4. Subcarinal lymphadenopathy, metastatic disease not excluded. 5. Indeterminate hypodensities within the liver, slightly increased since prior study. Again, metastatic disease cannot be excluded. 6. Cholelithiasis without cholecystitis. 7. Left inguinal hernia containing a portion of the sigmoid colon. No bowel obstruction or incarceration. 8. Colonic diverticulosis without diverticulitis. 9. Cholelithiasis without cholecystitis. 10. Abdominal aortic aneurysm measuring 3.3 cm infrarenal. Recommend follow-up every 3 years.  Reference: J Am Coll Radiol 2013;10:789-794. 11.  Aortic Atherosclerosis (ICD10-I70.0). 12. Incidental bilateral hydroceles, unchanged. Electronically Signed   By: Sharlet Salina M.D.   On: 11/03/2022 14:57   DG Chest Portable 1 View  Result Date: 11/06/2022 CLINICAL DATA:  Provided history: Chest pain. Additional history provided: Weakness. Epigastric pain. Shortness of breath with exertion. Additional history obtained from electronic MEDICAL RECORD NUMBERHistory of lung cancer. EXAM: PORTABLE CHEST 1 VIEW COMPARISON:  Chest CT 12/05/2017. FINDINGS: Cardiomegaly. Aortic atherosclerosis. Emphysema, better delineated on the prior chest CT of 12/05/2017. Region of post-radiation fibrosis/scarring within the right upper lobe. Interstitial and linear opacities within the mid and lower right lung. No appreciable airspace consolidation on the left. Blunting of the bilateral costophrenic angles which may reflect trace pleural effusions or chronic pleural thickening. No evidence of pneumothorax. No acute osseous abnormality identified. Degenerative changes of the spine. IMPRESSION: 1. Region of post-radiation fibrosis/scarring within the right upper lobe. Please note, there is limited evaluation for residual/recurrent pulmonary malignancy by chest radiography. 2. Interstitial and linear opacities within the mid and lower right lung, which may reflect atelectasis, asymmetric edema and/or infection. 3. Bilateral pleural thickening and/or trace pleural effusions. 4. Cardiomegaly. 5. Aortic Atherosclerosis (ICD10-I70.0). Electronically Signed   By: Jackey Loge D.O.   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  This patient was not examined and agree with the decision given advanced age and significant septic shock.  Patient will be transition to comfort care soon per nurse    -------------------------------- Cardiology will sign off  For questions or updates, please contact [facility] Please consult [email or web address] for contact info under    Signed, Elmon Kirschner, MD  10/15/2022 7:12 PM

## 2022-11-15 NOTE — Progress Notes (Signed)
eLink Physician-Brief Progress Note Patient Name: Robert Perry DOB: [birth year] MRN: [id number]   Date of Service  10/29/2022  HPI/Events of Note  Critically ill male admitted with community-acquired pneumonia was suffered to cardiac arrest and is in persistent shock.  Notified of critical lactate.  eICU Interventions  Chart reviewed.  Patient's care is futile and has been made DNR on the basis of medical futility.  Lactic acidosis noted but expected.     Intervention Category Minor Interventions: Other:  Carilyn Goodpasture 10/18/2022, 8:10 PM

## 2022-11-15 NOTE — Progress Notes (Signed)
Pt terminally extubated.    10/17/2022 2140  Vent Select  Invasive or Noninvasive Invasive  Adult Vent Y  Vent end date 10/24/2022  Vent end time 2139

## 2022-11-15 NOTE — ED Provider Notes (Signed)
[facility] AT [facility] Provider Note   CSN: [id number] Arrival date & time: 10/27/2022  1055     History  Chief Complaint  Patient presents with   Weakness    Robert Perry is a 89 y.o. male.  Patient with history of hypertension, hyperlipidemia presents today with complaints of generalized weakness. He states that he has felt generally weak for the past 1 week. He also notes that he has had epigastric pain with chest pain and shortness of breath. He denies history of similar symptoms previously. Denies any cardiac history. Denies nausea, vomiting, or diarrhea. He does not smoke. No history of abdominal surgeries. He is not on oxygen at home. Denies any leg pain or leg swelling, no PND or orthopnea.   The history is provided by the patient. No language interpreter was used.  Weakness Associated symptoms: abdominal pain, chest pain and shortness of breath        Home Medications Prior to Admission medications   Medication Sig Start Date End Date Taking? Authorizing Provider  allopurinol (ZYLOPRIM) 300 MG tablet Take 300 mg by mouth daily.    [provider]  amLODipine (NORVASC) 5 MG tablet Take by mouth.    [provider]  aspirin EC 81 MG tablet Take 81 mg by mouth daily.    [provider]  atenolol (TENORMIN) 25 MG tablet Take 12.5 mg by mouth daily.    [provider]  benazepril (LOTENSIN) 20 MG tablet Take 20 mg by mouth daily.    [provider]  Latanoprost 0.005 % EMUL PLACE 1 DROP INTO EACH EYE NIGHTLY 09/10/17   [provider]  timolol (TIMOPTIC) 0.5 % ophthalmic solution Place 1 drop into both eyes daily. 02/09/16   [provider]  travoprost, benzalkonium, (TRAVATAN) 0.004 % ophthalmic solution 1 drop at bedtime.    [provider]      Allergies    Patient has no known allergies.    Review of Systems   Review of Systems  Respiratory:  Positive for  shortness of breath.   Cardiovascular:  Positive for chest pain.  Gastrointestinal:  Positive for abdominal pain.  Neurological:  Positive for weakness.  All other systems reviewed and are negative.   Physical Exam Updated Vital Signs BP (!) 92/58 (BP Location: Left Arm)   Pulse 100   Temp (!) 97.4 F (36.3 C) (Oral)   Resp (!) 26   Ht 5\' 6"  (1.676 m)   Wt 65.8 kg   SpO2 (!) 87%   BMI 23.40 kg/m  Physical Exam Vitals and nursing note reviewed.  Constitutional:      General: He is not in acute distress.    Appearance: Normal appearance. He is normal weight. He is not ill-appearing, toxic-appearing or diaphoretic.  HENT:     Head: Normocephalic and atraumatic.  Cardiovascular:     Rate and Rhythm: Normal rate.  Pulmonary:     Effort: Tachypnea present.  Abdominal:     General: There is distension.     Tenderness: There is abdominal tenderness in the epigastric area.  Musculoskeletal:        General: Normal range of motion.     Cervical back: Normal range of motion.     Right lower leg: No edema.     Left lower leg: No edema.  Skin:    General: Skin is warm and dry.  Neurological:     General: No focal deficit present.  Mental Status: He is alert.  Psychiatric:        Mood and Affect: Mood normal.        Behavior: Behavior normal.     ED Results / Procedures / Treatments   Labs (all labs ordered are listed, but only abnormal results are displayed) Labs Reviewed  LACTIC ACID, PLASMA - Abnormal; Notable for the following components:      Result Value   Lactic Acid, Venous 3.1 (*)    All other components within normal limits  LACTIC ACID, PLASMA - Abnormal; Notable for the following components:   Lactic Acid, Venous 4.3 (*)    All other components within normal limits  COMPREHENSIVE METABOLIC PANEL - Abnormal; Notable for the following components:   CO2 20 (*)    Glucose, Bld 200 (*)    BUN 43 (*)    Creatinine, Ser 1.67 (*)    AST 42 (*)    Total  Bilirubin 1.3 (*)    GFR, Estimated 37 (*)    All other components within normal limits  BRAIN NATRIURETIC PEPTIDE - Abnormal; Notable for the following components:   B Natriuretic Peptide 2,402.0 (*)    All other components within normal limits  URINALYSIS, W/ REFLEX TO CULTURE (INFECTION SUSPECTED) - Abnormal; Notable for the following components:   Color, Urine AMBER (*)    APPearance HAZY (*)    Ketones, ur 5 (*)    Protein, ur 30 (*)    All other components within normal limits  CBC WITH DIFFERENTIAL/PLATELET - Abnormal; Notable for the following components:   WBC 11.3 (*)    RBC 3.78 (*)    Hemoglobin 12.3 (*)    HCT 37.1 (*)    Neutro Abs 9.9 (*)    Lymphs Abs 0.5 (*)    Abs Immature Granulocytes 0.08 (*)    All other components within normal limits  BLOOD GAS, VENOUS - Abnormal; Notable for the following components:   pCO2, Ven 38 (*)    pO2, Ven <31 (*)    Bicarbonate 19.6 (*)    Acid-base deficit 6.2 (*)    All other components within normal limits  TROPONIN I (HIGH SENSITIVITY) - Abnormal; Notable for the following components:   Troponin I (High Sensitivity) 2,746 (*)    All other components within normal limits  TROPONIN I (HIGH SENSITIVITY) - Abnormal; Notable for the following components:   Troponin I (High Sensitivity) 2,785 (*)    All other components within normal limits  RESP PANEL BY RT-PCR (RSV, FLU A&B, COVID)  [id number]  CULTURE, BLOOD (ROUTINE X 2)  CULTURE, BLOOD (ROUTINE X 2)  PROTIME-INR  APTT  LIPASE, BLOOD  I-STAT CHEM 8, ED    EKG EKG Interpretation Date/Time:  Saturday November 10 2022 11:20:33 EDT Ventricular Rate:  104 PR Interval:  142 QRS Duration:  163 QT Interval:  379 QTC Calculation: 499 R Axis:   -15  Text Interpretation: Ectopic atrial tachycardia, unifocal Left bundle branch block Baseline wander in lead(s) V3 COPY Confirmed by Meridee Score [phone]) on 11/08/2022 11:30:27 AM  Radiology CT CHEST ABDOMEN PELVIS WO  CONTRAST  Result Date: 10/24/2022 CLINICAL DATA:  Weakness, epigastric pain, sepsis, dyspnea on exertion EXAM: CT CHEST, ABDOMEN AND PELVIS WITHOUT CONTRAST TECHNIQUE: Multidetector CT imaging of the chest, abdomen and pelvis was performed following the standard protocol without IV contrast. RADIATION DOSE REDUCTION: This exam was performed according to the departmental dose-optimization program which includes automated exposure control, adjustment of the mA and/or  kV according to patient size and/or use of iterative reconstruction technique. COMPARISON:  10/24/2022, 12/05/2017, 06/22/2016 FINDINGS: CT CHEST FINDINGS Cardiovascular: The heart is enlarged without pericardial effusion. Normal caliber of the thoracic aorta. Atherosclerosis of the aorta and coronary vasculature. Valuation of the vascular lumen is limited without IV contrast. Mediastinum/Nodes: Subcarinal adenopathy measuring up to 15 mm in short axis, reference image 29/2. No other pathologic adenopathy. Thyroid, trachea, and esophagus are unremarkable. Lungs/Pleura: There are small bilateral pleural effusions, right greater than left. Dense consolidation and volume loss at the lung bases, favor atelectasis over infection or aspiration. Dilated central pulmonary vasculature and mild interlobular septal thickening consistent with fluid overload and interstitial edema. Interval increase in size of the right suprahilar soft tissue density at the site of prior treated lung cancer, now measuring 3.3 x 2.9 cm reference image 61/5. This results in obstruction of the segmental right upper lobe bronchi, and recurrent disease cannot be excluded. If the patient would be a therapy candidate should neoplasm be detected, follow-up PET scan could be considered. Part solid left upper lobe pulmonary nodule has increased in size, measuring 10 x 7 mm reference image 25/5, previously measuring 7 x 5 mm. Musculoskeletal: No acute or destructive bony abnormalities.  Reconstructed images demonstrate no additional findings. CT ABDOMEN PELVIS FINDINGS Hepatobiliary: Multiple calcified gallstones without cholecystitis. Indeterminate hypodensities within the left lobe liver, measuring 1 cm image 58/2 and 1.1 cm image 60/2. Indeterminate hypodensity right lobe liver measuring 1.4 cm image 75/2. These have increased slightly in size since previous exams. No biliary duct dilation. Pancreas: Unremarkable unenhanced appearance. Spleen: Unremarkable unenhanced appearance. Adrenals/Urinary Tract: The adrenals are unremarkable. Bilateral renal cortical thinning. Nonobstructing 7 mm left renal calculus. No obstructive uropathy within either kidney. Bladder is decompressed, limiting its evaluation. Stomach/Bowel: No bowel obstruction or ileus. Colonic diverticulosis without diverticulitis. Normal appendix right lower quadrant. No bowel wall thickening or inflammatory change. Vascular/Lymphatic: Stable 3.3 cm infrarenal abdominal aortic aneurysm. No pathologic adenopathy. Reproductive: Prostate is unremarkable. Other: No free fluid or free intraperitoneal gas. There is a large left inguinal hernia containing a a segment of the sigmoid colon. No evidence of bowel incarceration or obstruction. Bilateral hydroceles are partially visualized. Musculoskeletal: No acute or destructive bony abnormalities. Reconstructed images demonstrate no additional findings. IMPRESSION: 1. Enlarging soft tissue density in the right suprahilar region, with occlusion of segmental right upper lobe bronchi. This is at the site of prior treated lung cancer, and disease recurrence/progression cannot be excluded. Further evaluation with PET scan could be performed if the patient would be a therapy candidate should neoplasm be detected. 2. Cardiomegaly, pulmonary vascular congestion, interlobular septal thickening, and bilateral pleural effusions. Constellation of findings is most consistent with congestive heart failure  and fluid overload. 3. Dense dependent bilateral lower lobe consolidation, favor atelectasis over airspace disease or aspiration. 4. Subcarinal lymphadenopathy, metastatic disease not excluded. 5. Indeterminate hypodensities within the liver, slightly increased since prior study. Again, metastatic disease cannot be excluded. 6. Cholelithiasis without cholecystitis. 7. Left inguinal hernia containing a portion of the sigmoid colon. No bowel obstruction or incarceration. 8. Colonic diverticulosis without diverticulitis. 9. Cholelithiasis without cholecystitis. 10. Abdominal aortic aneurysm measuring 3.3 cm infrarenal. Recommend follow-up every 3 years. Reference: J Am Coll Radiol 2013;10:789-794. 11.  Aortic Atherosclerosis (ICD10-I70.0). 12. Incidental bilateral hydroceles, unchanged. Electronically Signed   By: Sharlet Salina M.D.   On: 11/09/2022 14:57   DG Chest Portable 1 View  Result Date: 11/03/2022 CLINICAL DATA:  Provided history: Chest pain. Additional  history provided: Weakness. Epigastric pain. Shortness of breath with exertion. Additional history obtained from electronic MEDICAL RECORD NUMBERHistory of lung cancer. EXAM: PORTABLE CHEST 1 VIEW COMPARISON:  Chest CT 12/05/2017. FINDINGS: Cardiomegaly. Aortic atherosclerosis. Emphysema, better delineated on the prior chest CT of 12/05/2017. Region of post-radiation fibrosis/scarring within the right upper lobe. Interstitial and linear opacities within the mid and lower right lung. No appreciable airspace consolidation on the left. Blunting of the bilateral costophrenic angles which may reflect trace pleural effusions or chronic pleural thickening. No evidence of pneumothorax. No acute osseous abnormality identified. Degenerative changes of the spine. IMPRESSION: 1. Region of post-radiation fibrosis/scarring within the right upper lobe. Please note, there is limited evaluation for residual/recurrent pulmonary malignancy by chest radiography. 2. Interstitial  and linear opacities within the mid and lower right lung, which may reflect atelectasis, asymmetric edema and/or infection. 3. Bilateral pleural thickening and/or trace pleural effusions. 4. Cardiomegaly. 5. Aortic Atherosclerosis (ICD10-I70.0). Electronically Signed   By: Jackey Loge D.O.   On: 10/17/2022 13:00    Procedures .Critical Care  Performed by: Silva Bandy, PA-C Authorized by: Silva Bandy, PA-C   Critical care provider statement:    Critical care time (minutes):  75   Critical care was necessary to treat or prevent imminent or life-threatening deterioration of the following conditions:  Cardiac failure, circulatory failure, respiratory failure, renal failure, sepsis and shock   Critical care was time spent personally by me on the following activities:  Development of treatment plan with patient or surrogate, discussions with primary provider, evaluation of patient's response to treatment, examination of patient, obtaining history from patient or surrogate, ordering and review of laboratory studies, ordering and review of radiographic studies, pulse oximetry, re-evaluation of patient's condition and review of old charts   Care discussed with: admitting provider       Medications Ordered in ED Medications  ceFEPIme (MAXIPIME) 2 g in sodium chloride 0.9 % 100 mL IVPB (has no administration in time range)  vancomycin (VANCOCIN) IVPB 1000 mg/200 mL premix (has no administration in time range)  lactated ringers bolus 1,000 mL (0 mLs Intravenous Stopped 11/04/2022 1349)  ceFEPIme (MAXIPIME) 2 g in sodium chloride 0.9 % 100 mL IVPB (0 g Intravenous Stopped 10/24/2022 1436)  metroNIDAZOLE (FLAGYL) IVPB 500 mg (0 mg Intravenous Stopped 11/04/2022 1426)  vancomycin (VANCOREADY) IVPB 1250 mg/250 mL (0 mg Intravenous Stopped 11/07/2022 1502)    ED Course/ Medical Decision Making/ A&P Clinical Course as of 10/25/2022 1539  Sat Nov 10, 2022  [id number] 89 year old male here with general weakness,  abdominal distention and recent constipation.  He said he ate a bunch of prunes recently which has gotten his bowels moving.  Still feels rather weak though.  His blood pressure is low here.  Abdomen is slightly distended but soft and nontender.  Getting labs EKG will likely need further imaging.  Anticipate will need admission to the hospital. [MB]    Clinical Course User Index [MB] Terrilee Files, MD                             Medical Decision Making Amount and/or Complexity of Data Reviewed Labs: ordered. Radiology: ordered.  Risk Prescription drug management. Decision regarding hospitalization.   This patient is a 89 y.o. male who presents to the ED for concern of weakness, this involves an extensive number of treatment options, and is a complaint that carries with it a high risk of  complications and morbidity. The emergent differential diagnosis prior to evaluation includes, but is not limited to,  ACS/PE, CHF, arrhythmia, syncope, orthostatic hypotension, sepsis, hypoglycemia, hypoxia, electrolyte disturbance, endocrine disorder, anemia, environmental exposure, polypharmacy   This is not an exhaustive differential.   Past Medical History / Co-morbidities / Social History: history of hypertension, hyperlipidemia   Additional history: Chart reviewed. Patient had findings concerning for lung cancer in 2018 but did not want a biopsy at that time. Unclear if he ever had any type of radiation or chemo for this at that time but no treatment since 2018.   Physical Exam: Physical exam performed. The pertinent findings include: Patient tachypneic, hypoxic, and hypotensive.  Some abdominal distention and epigastric tenderness.  Extremities are cool.  Lab Tests: I ordered, and personally interpreted labs.  The pertinent results include:  WBC 11.3, hgb 12.3. Bicarb 20, BUN 43, Creatinine 1.67, glucose 200. Last available labs from 4 years ago, abnormal findings all new. BNP 2,400.  Troponin 2,746 --> 2,785. Lactic 3.1 --> 4.3. UA noninfectious. Blood cultures pending   Imaging Studies: I ordered imaging studies including CXR, CT chest abdomen pelvis. I independently visualized and interpreted imaging which showed   CXR:  1. Region of post-radiation fibrosis/scarring within the right upper lobe. 2. Interstitial and linear opacities within the mid and lower right lung, which may reflect atelectasis, asymmetric edema and/or infection. 3. Bilateral pleural thickening and/or trace pleural effusions. 4. Cardiomegaly. 5. Aortic Atherosclerosis  CT 1. Enlarging soft tissue density in the right suprahilar region, with occlusion of segmental right upper lobe bronchi. This is at the site of prior treated lung cancer, and disease recurrence/progression cannot be excluded.  2. Cardiomegaly, pulmonary vascular congestion, interlobular septal thickening, and bilateral pleural effusions. Constellation of findings is most consistent with congestive heart failure and fluid overload. 3. Dense dependent bilateral lower lobe consolidation, favor atelectasis over airspace disease or aspiration. 4. Subcarinal lymphadenopathy, metastatic disease not excluded. 5. Indeterminate hypodensities within the liver, slightly increased since prior study. Again, metastatic disease cannot be excluded. 6. Abdominal aortic aneurysm measuring 3.3 cm infrarenal. 7.  Aortic Atherosclerosis  I agree with the radiologist interpretation.   Cardiac Monitoring:  The patient was maintained on a cardiac monitor.  My attending physician Dr. Charm Barges viewed and interpreted the cardiac monitored which showed an underlying rhythm of: EAT, LBBB, new from previous in 2018. I agree with this interpretation.   Medications: I ordered medication including 1 L bolus fluids, vancomycin, cefepime, Flagyl for sepsis.   Disposition: After consideration of the diagnostic results and the patients response to treatment, I  feel that patient will require ICU admission for hypotension, hypoxia, concerning for sepsis with with new CHF with exacerbation.   Patient initially presented hypoxic and tachypneic, continued to sat in the mid 80s on 6 L via nasal cannula.  Given concern for fluid overload and new CHF with hypoxia, felt the patient would benefit from BiPAP which was ordered, however unfortunately patient was unable to tolerate this due to feeling claustrophobic with the mask.  I discussed the importance of this modality of treatment and patient continued to refuse.  He was then placed on high flow O2 via nasal cannula and his oxygen increased to 90% and his respiratory rate decreased to the 20s.  Patient continues to be hypotensive, however his map is maintaining at 28 and therefore we have not initiated vasopressors.  Given 1 L of fluids, however will hold on any additional fluids given his fluid overload  status.   I did have a long conversation about CODE STATUS with the patient, he would like to be a full code and receive chest compressions and intubation if necessary. Up until this point patient has been very healthy without any remarkable medical events. He is alert and oriented and neurologically intact without any focal deficits and seems extremely sharp mentally considering his age and condition. He lives alone and has no contact with direct family members. Patient's neighbor and friend Peterson Ao is present any the bedside and is who he would like to make his medical decisions if was to become unable to. Aurea Graff is amenable to this and was present for this conversation and is aware of his current condition. Her cell phone number is [phone]. She notes that she will be able to travel to [facility] to visit him once he is transferred there.   Discussed patient with intensivist Dr. Merrily Pew who accepts patient for admission to ICU at [facility]  This is a shared visit with supervising physician Dr. Charm Barges who has  independently evaluated patient & provided guidance in evaluation/management/disposition, in agreement with care   Final Clinical Impression(s) / ED Diagnoses Final diagnoses:  Sepsis with acute hypoxic respiratory failure and septic shock, due to unspecified organism [facility])  AKI (acute kidney injury) (HCC)  Elevated troponin    Rx / DC Orders ED Discharge Orders     None         Vear Clock 10/18/2022 1644    Terrilee Files, MD 10/30/2022 1806

## 2022-11-15 NOTE — Progress Notes (Signed)
  Interdisciplinary Goals of Care Family Meeting   Date carried out: 10/30/2022  Location of the meeting: Bedside  Member's involved: Physician, Bedside Registered Nurse, and Family Member or next of kin  Durable Power of Attorney or acting medical decision maker: Lindell Spar    Discussion: We discussed goals of care for Robert Perry .   Discussed worsening clinical status, agonal breathing pattern, mottling, CT findings. There is no way through this and unfortunately patient appears to be suffering.  Code status:   Code Status: DNR   Disposition: In-patient comfort care  Time spent for the meeting: 12 mins    Lorin Glass, MD  10/18/2022, 7:42 PM

## 2022-11-15 NOTE — ED Notes (Signed)
[facility] was in room to transfer pt to [facility], [facility] nurse called a code blue at 1645, compressions started, got a pulse back at 1646, dose of epi at 1647. Pt was intubated with a 7.5 ET, secured at 23 cm at the lip, 16 fr OG placed, stomach contents noted, chest x-ray done and confirmed placement per MD. Nurse Leotis Shames [facility] was updated on pts condition, [facility] left with pt.

## 2022-11-15 DEATH — deceased
